# Patient Record
Sex: Female | Born: 1975 | Race: White | Hispanic: No | Marital: Married | Smoking: Former smoker
Health system: Southern US, Community
[De-identification: ages and names within clinical notes are randomized; demographics above are authoritative.]

## PROBLEM LIST (undated history)

## (undated) DIAGNOSIS — R51 Headache: Secondary | ICD-10-CM

## (undated) DIAGNOSIS — F32A Depression, unspecified: Secondary | ICD-10-CM

## (undated) DIAGNOSIS — R519 Headache, unspecified: Secondary | ICD-10-CM

## (undated) DIAGNOSIS — K08109 Complete loss of teeth, unspecified cause, unspecified class: Secondary | ICD-10-CM

## (undated) DIAGNOSIS — F329 Major depressive disorder, single episode, unspecified: Secondary | ICD-10-CM

## (undated) DIAGNOSIS — R569 Unspecified convulsions: Secondary | ICD-10-CM

## (undated) DIAGNOSIS — F419 Anxiety disorder, unspecified: Secondary | ICD-10-CM

## (undated) DIAGNOSIS — K219 Gastro-esophageal reflux disease without esophagitis: Secondary | ICD-10-CM

## (undated) DIAGNOSIS — Z972 Presence of dental prosthetic device (complete) (partial): Secondary | ICD-10-CM

## (undated) HISTORY — DX: Anxiety disorder, unspecified: F41.9

## (undated) HISTORY — DX: Headache, unspecified: R51.9

## (undated) HISTORY — DX: Headache: R51

## (undated) HISTORY — DX: Complete loss of teeth, unspecified cause, unspecified class: K08.109

## (undated) HISTORY — DX: Major depressive disorder, single episode, unspecified: F32.9

## (undated) HISTORY — DX: Unspecified convulsions: R56.9

## (undated) HISTORY — DX: Gastro-esophageal reflux disease without esophagitis: K21.9

## (undated) HISTORY — DX: Presence of dental prosthetic device (complete) (partial): Z97.2

## (undated) HISTORY — PX: WISDOM TOOTH EXTRACTION: SHX21

## (undated) HISTORY — DX: Depression, unspecified: F32.A

## (undated) HISTORY — PX: TUBAL LIGATION: SHX77

---

## 2001-02-21 ENCOUNTER — Ambulatory Visit (HOSPITAL_COMMUNITY): Admission: RE | Admit: 2001-02-21 | Discharge: 2001-02-21 | Payer: Self-pay | Admitting: Specialist

## 2001-10-23 ENCOUNTER — Emergency Department (HOSPITAL_COMMUNITY): Admission: EM | Admit: 2001-10-23 | Discharge: 2001-10-23 | Payer: Self-pay | Admitting: Emergency Medicine

## 2001-10-31 ENCOUNTER — Encounter: Payer: Self-pay | Admitting: Internal Medicine

## 2001-10-31 ENCOUNTER — Ambulatory Visit (HOSPITAL_COMMUNITY): Admission: RE | Admit: 2001-10-31 | Discharge: 2001-10-31 | Payer: Self-pay | Admitting: Internal Medicine

## 2003-06-13 ENCOUNTER — Ambulatory Visit (HOSPITAL_COMMUNITY): Admission: RE | Admit: 2003-06-13 | Discharge: 2003-06-13 | Payer: Self-pay | Admitting: *Deleted

## 2003-08-13 ENCOUNTER — Ambulatory Visit (HOSPITAL_COMMUNITY): Admission: RE | Admit: 2003-08-13 | Discharge: 2003-08-13 | Payer: Self-pay | Admitting: *Deleted

## 2003-11-07 ENCOUNTER — Inpatient Hospital Stay (HOSPITAL_COMMUNITY): Admission: AD | Admit: 2003-11-07 | Discharge: 2003-11-09 | Payer: Self-pay | Admitting: *Deleted

## 2003-12-12 ENCOUNTER — Ambulatory Visit (HOSPITAL_COMMUNITY): Admission: RE | Admit: 2003-12-12 | Discharge: 2003-12-12 | Payer: Self-pay | Admitting: *Deleted

## 2004-06-25 ENCOUNTER — Emergency Department (HOSPITAL_COMMUNITY): Admission: EM | Admit: 2004-06-25 | Discharge: 2004-06-25 | Payer: Self-pay | Admitting: Emergency Medicine

## 2004-08-30 ENCOUNTER — Emergency Department (HOSPITAL_COMMUNITY): Admission: EM | Admit: 2004-08-30 | Discharge: 2004-08-30 | Payer: Self-pay | Admitting: Emergency Medicine

## 2007-10-29 ENCOUNTER — Emergency Department (HOSPITAL_COMMUNITY): Admission: EM | Admit: 2007-10-29 | Discharge: 2007-10-29 | Payer: Self-pay | Admitting: Emergency Medicine

## 2009-02-02 ENCOUNTER — Emergency Department (HOSPITAL_COMMUNITY): Admission: EM | Admit: 2009-02-02 | Discharge: 2009-02-02 | Payer: Self-pay | Admitting: Emergency Medicine

## 2009-12-10 ENCOUNTER — Emergency Department (HOSPITAL_COMMUNITY): Admission: EM | Admit: 2009-12-10 | Discharge: 2009-12-10 | Payer: Self-pay | Admitting: Emergency Medicine

## 2010-11-17 LAB — URINALYSIS, ROUTINE W REFLEX MICROSCOPIC
Bilirubin Urine: NEGATIVE
Glucose, UA: NEGATIVE mg/dL
Nitrite: NEGATIVE
Protein, ur: >300 mg/dL — AB
Specific Gravity, Urine: 1.015 (ref 1.005–1.030)
Urobilinogen, UA: 1 mg/dL (ref 0.0–1.0)
pH: 8.5 — ABNORMAL HIGH (ref 5.0–8.0)

## 2010-11-17 LAB — URINE MICROSCOPIC-ADD ON

## 2010-12-26 NOTE — Discharge Summary (Signed)
[name], [name]                       ACCOUNT NO.:  [id number]   MEDICAL RECORD NO.:  [id number]                   PATIENT TYPE:  INP   LOCATION:  A402                                 FACILITY:  [facility]   PHYSICIAN:  Langley Gauss, M.D.                DATE OF BIRTH:  May 23, 1976   DATE OF ADMISSION:  11/07/2003  DATE OF DISCHARGE:  11/09/2003                                 DISCHARGE SUMMARY   DATE OF ADMISSION:  November 07, 2003.   DATE OF EPIDURAL:  November 07, 2003.   DATE OF VAGINAL DELIVERY:  November 07, 2003.   DATE OF DISCHARGE:  November 09, 2003.   DIAGNOSES:  Term pregnancy, delivered utilizing epidural analgesic.   DISPOSITION:  At the time of discharge the patient is bottle feeding.  She  does desire permanent sterilization.  This has been discussed previously in  the office.  She will thus, follow up in the office in 3 weeks time for  scheduling of this as an outpatient procedure.  The patient is give a copy  of the standardized discharge instructions at the time of discharge.   PERTINENT LABORATORY STUDIES:  GBS carrier status is negative.  O positive  blood type.  Admission hemoglobin and hematocrit 11.0/32.6 with a white  count of 14.2; postpartum day #1 H&H 10.8/31.8 with a white count of 8.6.   HOSPITAL COURSE:  See previous dictations.  The patient delivered in a well  controlled manner on November 07, 2003.  Postpartum the patient did well.  She  had no postpartum complications.  She bonded well with the infant and had no  significant problems with postpartum bleeding, thus she was discharged to  home on November 09, 2003. Pertinently infant circumcision also performed on  November 09, 2003.     ___________________________________________                                         Langley Gauss, M.D.   DC/MEDQ  D:  11/11/2003  T:  11/12/2003  Job:  [id number]

## 2010-12-26 NOTE — H&P (Signed)
[name], BROUSE                       ACCOUNT NO.:  [id number]   MEDICAL RECORD NO.:  [id number]                   PATIENT TYPE:  INP   LOCATION:  A417                                 FACILITY:  [facility]   PHYSICIAN:  Langley Gauss, M.D.                DATE OF BIRTH:  June 07, 1976   DATE OF ADMISSION:  11/07/2003  DATE OF DISCHARGE:                                HISTORY & PHYSICAL   REASON FOR ADMISSION:  This is a 35 year old Gravida IV, Para 3 at 71 and  4/[redacted] weeks gestation who is admitted for induction of labor secondary to  impending post dates.  The patient's prenatal course has been uncomplicated.  She is noted to be O positive blood type.  She has had 19 and 27 week  ultrasounds with normal anatomic survey and normal fetal growth.  She is  known to be GBS carrier status negative.  She has had normal GTT's with  previous pregnancies, thus it was not repeated during this pregnancy.   PAST OB HISTORY:  Pertinent, however, for a vaginal delivery on May 04, 1997 at [facility] of a 7 pound 2 ounce infant.  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  The patient did have a level  two ultrasound scheduled at [facility] in [place] during the  second trimester.  However, she failed to make appropriate effort to keeping  this appointment.  She actually got to the appointment too late and the  ultrasound was not performed at that time and the patient did not  reschedule.  However, as stated previously anatomic survey performed per  [facility] department of radiology was within normal limits.  In  January of 1994 she had a vaginal delivery of a 7 pound 12 ounce infant.  In  September of 1998 she had a vaginal delivery  of a 7 pound 2 ounce infant.  In August of 2001 she had a vaginal delivery of a 7 pound 7 ounce infant.  In the patient's third pregnancy she was noted to have an abnormal AFP.  She  did have an amniocentesis which was normal.  The APT triple screen during  this pregnancy is noted to be within normal limits.   ALLERGIES:  The patient has no known drug allergies.   PAST MEDICAL HISTORY:  1. Prior vaginal deliveries.  2. History of recurrent oral HSV, however, she denies any history of genital     HSV.   PHYSICAL EXAMINATION:  GENERAL:  She is in no acute distress, a white  female.  VITAL SIGNS:  Height is 5'4, pre-pregnancy weight was 128, most recent  pregnancy 167, blood pressure 123/77,  pulse rate 80, respiratory rate 20.  HEENT:  Negative.  There is no adenopathy.  The neck is supple.  The thyroid  is non-palpable.  LUNGS:  Clear.  CARDIOVASCULAR EXAM:  Regular rate and rhythm.  ABDOMEN:  Soft and non-tender.  Gravid uterus identified.  No surgical scars  are identified.  She is vertex presentation by Leopold's maneuvers.  EXTREMITIES:  Within normal limits.  Trace edema only.  PELVIC EXAM:  Normal external genitalia.  No lesions or ulcerations are  identified.  No vaginal bleeding or leakage of fluid.  Cervix is noted to be  3 cm dilated, 70% effaced, -1 station, posterior position of the cervix with  the vertex, however, well applied.  Fetal heart tones are auscultated in the  150's.   ASSESSMENT:  Forty and 4/7 week intrauterine pregnancy in this multigravida  patient.  The patient is noted to have a history of rapid second stage of  labor.  She is GBS negative.  She is noted to have a very favorable cervix  for induction.   PLAN:  The plan is to admit the patient, proceed with amniotomy and after  appropriate expectant management we will augment or induce with Pitocin as  clinically indicated.     ___________________________________________                                          Langley Gauss, M.D.   DC/MEDQ  D:  11/07/2003  T:  11/07/2003  Job:  [id number]

## 2010-12-26 NOTE — H&P (Signed)
[name], [name]                       ACCOUNT NO.:  [id number]   MEDICAL RECORD NO.:  [id number]                   PATIENT TYPE:  AMB   LOCATION:  DAY                                  FACILITY:  [facility]   PHYSICIAN:  Langley Gauss, M.D.                DATE OF BIRTH:  [birth year]   DATE OF ADMISSION:  12/12/2003  DATE OF DISCHARGE:                                HISTORY & PHYSICAL   HISTORY OF PRESENT ILLNESS:  This is a 35 year old gravida 4, para 4 with  four prior vaginal deliveries with a 5th child also living in her home, who  desires permanent and irreversible sterilization at this time.  She accepts  the inherent 2% failure rate associated with the procedure, and does  understand that this is to be considered an irreversible procedure.  The  patient is postpartum OB November 07, 2003, without complications, following  delivery of her fourth child.   PAST MEDICAL HISTORY:  The patient does have a history of oral HSV.  She  also has tenia versicolor on the back of her neck which is just begun on  retreatment following her recent delivery.  She has four prior vaginal  deliveries without complications.   ALLERGIES:  She has no known drug allergies.   CURRENT MEDICATIONS:  She was recently started on Flagyl 500 mg p.o. b.i.d.  x7 days for a brownish, postpartum vaginal discharge.   PHYSICAL EXAMINATION:  GENERAL:  A pleasant, white female, nonsmoker.  VITAL SIGNS:  She is 5 feet, 4 inches, 152 pounds, blood pressure 143/79,  pulse rate of 80, respiratory rate 20.  HEENT:  Negative.  No adenopathy.  NECK:  Supple.  Thyroid is nonpalpable.  LUNGS:  Clear.  CARDIOVASCULAR:  Regular rate and rhythm.  ABDOMEN:  Soft, nontender, no surgical scars were identified.  PELVIC:  On exam, normal external genitalia.  No lesions or ulcerations were  identified.  The bladder is well supported as well as the urethra.  Cervix  is noted to be without lesions.  The brown discharge described  previously  has been treated.  Bimanual examination, the uterus is noted to be slightly  increased size, consistent with postpartum state.  It is noted to be  retroflexed and freely mobile.  There are no adnexal masses.  Adnexa are  palpably normal without tenderness appreciated.   LABORATORY DATA:  Reveal a negative serum hCG.  Hemoglobin 13.5, hematocrit  39.9 with a white count of 5.9.   ASSESSMENT:  1. Multiparous patient adamantly desires permanent sterilization.  This has     been discussed with her extensively during her prenatal course.  Now     following her fourth vaginal delivery, she likewise would like to pursue     permanent sterilization.  She are aware that there are other reversible     forms of birth control available, but would like to proceed at this time.  Her husband, likewise, is in agreement with this decision.  2. Thus, on Dec 12, 2003, we will proceed with laparoscopic tubal ligation     utilizing bipolar cautery.  Risks of the surgery were discussed to     include risk of bowel or bladder injury.  Also risk associated with the     use of general anesthesia.     ___________________________________________                                         Langley Gauss, M.D.   DC/MEDQ  D:  12/12/2003  T:  12/12/2003  Job:  [id number]

## 2010-12-26 NOTE — Op Note (Signed)
[name], [name]                       ACCOUNT NO.:  [id number]   MEDICAL RECORD NO.:  [id number]                   PATIENT TYPE:  INP   LOCATION:  A417                                 FACILITY:  [facility]   PHYSICIAN:  Langley Gauss, M.D.                DATE OF BIRTH:  04-28-1976   DATE OF PROCEDURE:  11/07/2003  DATE OF DISCHARGE:                                 OPERATIVE REPORT   PROCEDURE:  Placement of continuous lumbar epidural analgesia at the L2-3  interspace.   SURGEON:  Langley Gauss, M.D.   SUMMARY:  Appropriate informed consent is obtained.  Continuous electronic  fetal monitoring is performed. The patient placed in a seated position, bony  landmarks were identified. The L2-3 interspace is chosen. The patient's back  is sterilely prepped and draped utilizing the epidural tray.  5 mL of 1%  lidocaine plain injected at the midline of the L2-3 interspace to raise a  small skin wheal. This 17 gauge Tuohy-Schliff needle is then utilized with  loss of resistance in an air filled glass syringe to identify entry into the  epidural space on the first attempt without difficulty.  No signs of CSF or  intravascular injury noted. Initial test dose 5 mL of 1.5% lidocaine plus  epinephrine injected through the epidural needle. No signs of CSF or  intravascular injection obtained.  The epidural catheter was inserted to a  depth of 5 cm, needle was removed, aspiration test is negative. A second  test dose 2 mL, 1.5% lidocaine plus epinephrine injected through the  epidural catheter, again no signs of CSF or intravascular injection obtained  thus the catheter was secured into place.  The patient is returned to the  lateral supine position.  She is connected to the infusion pump with the  standard mixture, she will be treated with a bolus of 10 mL followed by a  continuous infusion rate of 14 mL per hour.  After administration of the  bolus, the patient is having significant numbness  and warmth in the right  lower extremity; however, continues to have the sensation of pain with  uterine contractions thus she is in addition given a bolus of 5, 5 mL of 2%  lidocaine to facilitate the epidural block.  The patient continues to have a  reassuring fetal heart rate. Notably just prior to placement, examination  per the nursing staff had revealed the patient to be 7 cm dilated.      ___________________________________________                                            Langley Gauss, M.D.   DC/MEDQ  D:  11/07/2003  T:  11/07/2003  Job:  [id number]

## 2010-12-26 NOTE — Discharge Summary (Signed)
[name], [name]                       ACCOUNT NO.:  [id number]   MEDICAL RECORD NO.:  [id number]                   PATIENT TYPE:  AMB   LOCATION:  DAY                                  FACILITY:  [facility]   PHYSICIAN:  Langley Gauss, M.D.                DATE OF BIRTH:  1975/08/11   DATE OF ADMISSION:  12/12/2003  DATE OF DISCHARGE:                                 DISCHARGE SUMMARY   DISPOSITION:  The patient is to be discharged today on same date of service.  She has a previously written prescription for Tylox which she has used in  the postpartum state.   LABORATORY DATA:  Reviewed previously.   HOSPITAL COURSE:  Admitted to the ambulatory surgical unit.  Laparoscopic  tubal ligation performed without complications on same date of service.  The  patient should be prepared for discharge likewise on same date of service.     ___________________________________________                                         Langley Gauss, M.D.   DC/MEDQ  D:  12/12/2003  T:  12/12/2003  Job:  [id number]

## 2010-12-26 NOTE — Op Note (Signed)
[name], [name]                       ACCOUNT NO.:  [id number]   MEDICAL RECORD NO.:  [id number]                   PATIENT TYPE:  INP   LOCATION:  A402                                 FACILITY:  [facility]   PHYSICIAN:  Langley Gauss, M.D.                DATE OF BIRTH:  Jul 10, 1976   DATE OF PROCEDURE:  11/07/2003  DATE OF DISCHARGE:                                 OPERATIVE REPORT   PREOPERATIVE DIAGNOSIS:  40-4/7 weeks intrauterine pregnancy for induction  of labor.   PROCEDURE:  1. Placement of continuous lumbar epidural analgesia by Dr. Lisette Grinder on     11/07/2003.  2. Spontaneous assisted vaginal delivery of a 7 pound 13 ounce female infant.  3. Midline episiotomy repair.   SURGEON:  Langley Gauss, M.D.   ESTIMATED BLOOD LOSS:  Less than 500 cc.   SPECIMENS:  Arterial cord gas and cord blood to laboratory.  The placenta  was examined and noted to be apparently intact with a three-vessel umbilical  cord.   SUMMARY:  The patient was admitted for induction of labor at 40-4/[redacted] weeks  gestation.  On initial examination, she was 3 cm dilated.  Amniotomy was  performed, with findings of clear amniotic fluid.  The patient did require  Pitocin induction to achieve adequate labor.  Initially she requested IV  Nubain and Phenergan for pain relief; however, with onset of discomfort  associated with uterine contractions, epidural was placed when the patient  was noted to be 7 cm dilated.  Subsequently, she progressed readily to  complete dilatation.  She had minimal urge to push, with discomfort  associated with uterine contractions.  When the patient began having pelvic  pressure, she was noted to be completely dilated.  She was placed in the  dorsal lithotomy position and prepped and draped in the usual sterile  manner.  She pushed well during a short second stage of labor to deliver in  a direct LOA position over an intact perineum.  The mouth and nares were  bulb suctioned of clear  amniotic fluid.  Renewed expulsive efforts resulted  in spontaneous rotation to a right anterior shoulder position.  Gentle  downward traction combined with expulsive efforts resulted in delivery of  the shoulder as well as the remainder of the infant without difficulty.  The  umbilical cord was milked towards the infant.  The cord was doubly clamped  and cut.  A spontaneous cry was noted.  Arterial cord gas and cord blood  were then obtained from the nuchal cord.  Gentle traction on the umbilical  cord resulted in separation which upon examination was noted to be an  apparently intact placenta with associated three-vessel umbilical cord.  Examination of the genital tract revealed an intact perineum.  No  lacerations were noted to  have occurred.  Excellent uterine tone was noted following delivery.  The  patient was taken out of the dorsal  lithotomy position and rolled to her  side at which time the epidural catheter was removed, with the blue tip  noted to be intact.  Delivery was performed by Dr. Langley Gauss.     ___________________________________________                                            Langley Gauss, M.D.   DC/MEDQ  D:  11/08/2003  T:  11/08/2003  Job:  [id number]

## 2010-12-26 NOTE — Op Note (Signed)
NAMEANNALYSA, [name]                       ACCOUNT NO.:  [id number]   MEDICAL RECORD NO.:  [id number]                   PATIENT TYPE:  AMB   LOCATION:  DAY                                  FACILITY:  [facility]   PHYSICIAN:  Langley Gauss, M.D.                DATE OF BIRTH:  April 22, 1976   DATE OF PROCEDURE:  12/12/2003  DATE OF DISCHARGE:                                 OPERATIVE REPORT   PREOPERATIVE DIAGNOSIS:  Desires permanent sterilization.   POSTOPERATIVE DIAGNOSIS:  Desires permanent sterilization.   OPERATION/PROCEDURE:  Laparoscopic tubal ligation utilizing bipolar cautery.   SURGEON:  Langley Gauss, M.D.   ANESTHESIA:  General endotracheal anesthesia.   SPECIMENS:  None.   DRAINS:  Prior to procedure, red rubber catheter was used to drain 75 mL of  clear, yellow urine from the bladder.   FINDINGS:  Multiparous size postpartum uterus.  Ovaries normal in  appearance.  Fallopian tubes likewise normal in appearance.  No adhesive  disease encountered.  With gentle traction of the cervix, however, there was  noted to be excellent uterine descent with the uterosacral ligaments  descending to the hymenal ring.   DESCRIPTION OF PROCEDURE:  The patient was taken to the operating room.  Vital signs were stable.  She underwent induction of general anesthesia.  Placed in the dorsal lithotomy position.  Prepped and draped in the sterile  manner.  The red rubber catheter was used to drain 75 mL of clear urine from  the bladder.  Sterile speculum examination was performed.  Cervix without  lesions.  Anterior lip of the cervix grasped with a single-tooth tenaculum.  A Hulka intrauterine manipulator was then used to grasp the cervix at 12  o'clock __________ the point into the lower uterine segment.  I then changed  to sterile gloves and the patient's left side, stand at the patient's left  side.  A 1 cm vertical incision was made just superior to the umbilicus.  A  midline  suprapubic incision is made.  Abdominal wall is then elevated.  A  Veress needle is then passed through the subumbilical incision perpendicular  to the fascial plane. Drop test confirms proper intraperitoneal placement  with no apparent injury noted.  A 3.5 L of CO2 gas insufflated, with filling  pressure less than 15 mmHg.  After adequate pneumoperitoneum, Veress needle  was removed.  Abdominal wall is again elevated.  The 10 mm Visiport,  disposable non-bladed trocar and sleeve are inserted through this  subumbilical incision, angling towards the hollow sacrum. This is likewise  done atraumatically under direct visualization.  Pelvic contents visualized.  Under direct visualization a 5 mm trocar and sleeve are then inserted  through the suprapubic incision.  This likewise is done atraumatically.  This then allows adequate visualization of the pelvis with the patient now  in the Trendelenburg position.  The Kleppinger bipolar cautery is then  utilized with the  triple cauterization technique performed with three  cauterizations at of each fallopian tube performed, the most proximal being  1 cm from the tubo-uterine junction.  At all times, cauterization is  continued until the meter drops down to 0.  This results in extensive tubal  __________ bilaterally with no apparent vascular injury.  As the procedure  is terminated, the gas is allowed to escape and her instruments are removed.  The incisions  are closed utilizing a 0 Vicryl interrupted suture through the subcutaneous  tissue which is easily reapproximates the skin edges.  The patient tolerated  the procedure very well.  She was extubated and taken to the recovery room  in stable condition at which time operative findings would be discussed with  the patient's awaiting family.      ___________________________________________                                            Langley Gauss, M.D.   DC/MEDQ  D:  12/12/2003  T:  12/12/2003   Job:  [id number]

## 2011-05-04 LAB — DIFFERENTIAL
Basophils Absolute: 0
Basophils Relative: 0
Eosinophils Absolute: 0
Eosinophils Relative: 0
Lymphocytes Relative: 10 — ABNORMAL LOW
Lymphs Abs: 1.2
Monocytes Absolute: 0.4
Monocytes Relative: 3
Neutro Abs: 10.3 — ABNORMAL HIGH
Neutrophils Relative %: 87 — ABNORMAL HIGH

## 2011-05-04 LAB — BASIC METABOLIC PANEL WITH GFR
BUN: 7
CO2: 24
Calcium: 9.4
Chloride: 108
Creatinine, Ser: 0.91
GFR calc non Af Amer: >60
Glucose, Bld: 97
Potassium: 3.9
Sodium: 138

## 2011-05-04 LAB — CBC
HCT: 37.8
Hemoglobin: 12.7
MCHC: 33.6
MCV: 77 — ABNORMAL LOW
RBC: 4.91
RDW: 13.4

## 2011-09-21 ENCOUNTER — Encounter (HOSPITAL_COMMUNITY): Payer: Self-pay | Admitting: Emergency Medicine

## 2011-09-21 ENCOUNTER — Emergency Department (HOSPITAL_COMMUNITY)
Admission: EM | Admit: 2011-09-21 | Discharge: 2011-09-21 | Disposition: A | Discharge status: Home / Self Care | Payer: Self-pay | Attending: Emergency Medicine | Admitting: Emergency Medicine

## 2011-09-21 DIAGNOSIS — Z87891 Personal history of nicotine dependence: Secondary | ICD-10-CM | POA: Insufficient documentation

## 2011-09-21 DIAGNOSIS — Z9851 Tubal ligation status: Secondary | ICD-10-CM | POA: Insufficient documentation

## 2011-09-21 DIAGNOSIS — N390 Urinary tract infection, site not specified: Secondary | ICD-10-CM | POA: Insufficient documentation

## 2011-09-21 LAB — URINALYSIS, ROUTINE W REFLEX MICROSCOPIC
Bilirubin Urine: NEGATIVE
Nitrite: POSITIVE — AB
Protein, ur: >300 mg/dL — AB
Specific Gravity, Urine: >1.03 — ABNORMAL HIGH (ref 1.005–1.030)
Urobilinogen, UA: >8 mg/dL — ABNORMAL HIGH (ref 0.0–1.0)

## 2011-09-21 LAB — URINE MICROSCOPIC-ADD ON

## 2011-09-21 MED ORDER — HYDROCODONE-ACETAMINOPHEN 5-325 MG PO TABS
1.0000 | ORAL_TABLET | Freq: Once | ORAL | Status: AC
  Administered 2011-09-21: 1 via ORAL
  Filled 2011-09-21: qty 1

## 2011-09-21 MED ORDER — CEPHALEXIN 500 MG PO CAPS: 500.0000 mg | ORAL_CAPSULE | Freq: Four times a day (QID) | ORAL | Status: AC

## 2011-09-21 MED ORDER — CEPHALEXIN 500 MG PO CAPS
500.0000 mg | ORAL_CAPSULE | Freq: Once | ORAL | Status: AC
  Administered 2011-09-21: 500 mg via ORAL
  Filled 2011-09-21: qty 1

## 2011-09-21 MED ORDER — PHENAZOPYRIDINE HCL 100 MG PO TABS
200.0000 mg | ORAL_TABLET | Freq: Once | ORAL | Status: AC
  Administered 2011-09-21: 200 mg via ORAL
  Filled 2011-09-21: qty 2

## 2011-09-21 MED ORDER — PHENAZOPYRIDINE HCL 200 MG PO TABS: 200.0000 mg | ORAL_TABLET | Freq: Three times a day (TID) | ORAL | Status: AC

## 2011-09-21 NOTE — Discharge Instructions (Signed)

## 2011-09-21 NOTE — ED Notes (Signed)
Patient c/o urinary tract infection symptoms x one week.  States has been taking AZO without any relief.

## 2011-09-21 NOTE — ED Provider Notes (Signed)
History     CSN: [id number]  Arrival date & time 09/21/11  2131   First MD Initiated Contact with Patient 09/21/11 2139      Chief Complaint  Patient presents with  . Urinary Tract Infection    (Consider location/radiation/quality/duration/timing/severity/associated sxs/prior treatment) Patient is a 36 y.o. female presenting with urinary tract infection. The history is provided by the patient. No language interpreter was used.  Urinary Tract Infection This is a new problem. The current episode started 1 to 4 weeks ago. The problem occurs constantly. The problem has been unchanged. Associated symptoms include abdominal pain, nausea and urinary symptoms. Pertinent negatives include no change in bowel habit, chills, fever, headaches, neck pain, sore throat, swollen glands, vomiting or weakness. Exacerbated by: Urination. Treatments tried: Over-the-counter AZO. The treatment provided no relief.    History reviewed. No pertinent past medical history.  Past Surgical History  Procedure Date  . Tubal ligation     History reviewed. No pertinent family history.  History  Substance Use Topics  . Smoking status: Former Games developer  . Smokeless tobacco: Not on file  . Alcohol Use: No    OB History    Grav Para Term Preterm Abortions TAB SAB Ect Mult Living                  Review of Systems  Constitutional: Negative for fever and chills.  HENT: Negative for sore throat and neck pain.   Gastrointestinal: Positive for nausea and abdominal pain. Negative for vomiting and change in bowel habit.  Genitourinary: Positive for dysuria, urgency, frequency and hematuria. Negative for flank pain, vaginal bleeding, vaginal discharge, vaginal pain and menstrual problem.  Musculoskeletal: Negative.   Skin: Negative.   Neurological: Negative for weakness and headaches.  All other systems reviewed and are negative.    Allergies  Review of patient's allergies indicates no known allergies.  Home  Medications  No current outpatient prescriptions on file.  BP 112/79  Pulse 62  Temp(Src) 97.6 F (36.4 C) (Oral)  Resp 18  Ht 5\' 4"  (1.626 m)  Wt 148 lb (67.132 kg)  BMI 25.40 kg/m2  SpO2 99%  LMP 09/07/2011  Physical Exam  Nursing note and vitals reviewed. Constitutional: She is oriented to person, place, and time. She appears well-developed and well-nourished. No distress.  HENT:  Head: Normocephalic and atraumatic.  Mouth/Throat: Oropharynx is clear and moist.  Neck: Neck supple.  Cardiovascular: Normal rate, regular rhythm and normal heart sounds.   Pulmonary/Chest: Effort normal and breath sounds normal. No respiratory distress.  Abdominal: Soft. She exhibits no distension and no mass. There is tenderness in the suprapubic area. There is no rebound, no guarding, no CVA tenderness and no tenderness at McBurney's point.  Musculoskeletal: Normal range of motion.  Neurological: She is alert and oriented to person, place, and time. She exhibits normal muscle tone. Coordination normal.  Skin: Skin is warm and dry.    ED Course  Procedures (including critical care time)  Results for orders placed during the hospital encounter of 09/21/11  URINALYSIS, ROUTINE W REFLEX MICROSCOPIC      Component Value Range   Color, Urine ORANGE (*) YELLOW    APPearance CLEAR  CLEAR    Specific Gravity, Urine >1.030 (*) 1.005 - 1.030    pH 5.0  5.0 - 8.0    Glucose, UA 250 (*) NEGATIVE (mg/dL)   Hgb urine dipstick LARGE (*) NEGATIVE    Bilirubin Urine NEGATIVE  NEGATIVE    Ketones,  ur TRACE (*) NEGATIVE (mg/dL)   Protein, ur >308 (*) NEGATIVE (mg/dL)   Urobilinogen, UA >6.5 (*) 0.0 - 1.0 (mg/dL)   Nitrite POSITIVE (*) NEGATIVE    Leukocytes, UA MODERATE (*) NEGATIVE   POCT PREGNANCY, URINE      Component Value Range   Preg Test, Ur NEGATIVE  NEGATIVE   URINE MICROSCOPIC-ADD ON      Component Value Range   Squamous Epithelial / LPF RARE  RARE    WBC, UA TOO NUMEROUS TO COUNT  <3  (WBC/hpf)   RBC / HPF 21-50  <3 (RBC/hpf)   Bacteria, UA MANY (*) RARE       Urine culture pending   MDM    UTI without fever vomiting or CVA tenderness doubt pyelonephritis.  Urine culture is pending we'll start antibiotics.  Patient agrees to close followup with her primary care physician or to return here if symptoms worsen    Patient / Family / Caregiver understand and agree with initial ED impression and plan with expectations set for ED visit. Pt feels improved after observation and/or treatment in ED. Pt stable in ED with no significant deterioration in condition.     Shenelle Klas L. [place], [place] 09/21/11 2223

## 2011-09-22 NOTE — ED Provider Notes (Signed)
Medical screening examination/treatment/procedure(s) were performed by non-physician practitioner and as supervising physician I was immediately available for consultation/collaboration.  Shelanda Duvall S. Phu Record, MD 09/22/11 1616 

## 2011-09-24 LAB — URINE CULTURE
Colony Count: 50000
Culture  Setup Time: 201302121439

## 2011-09-25 NOTE — ED Notes (Signed)
+   Urine  Chart sent to EDP office; no sensitivities listed 

## 2011-09-27 NOTE — ED Notes (Signed)
Chart returned from EDP office. No change needed. Reviewed by Levy Sjogren NP

## 2017-05-10 ENCOUNTER — Emergency Department (HOSPITAL_COMMUNITY)
Admission: EM | Admit: 2017-05-10 | Discharge: 2017-05-11 | Disposition: A | Discharge status: Home / Self Care | Payer: Self-pay | Attending: Emergency Medicine | Admitting: Emergency Medicine

## 2017-05-10 ENCOUNTER — Encounter (HOSPITAL_COMMUNITY): Payer: Self-pay | Admitting: Emergency Medicine

## 2017-05-10 ENCOUNTER — Emergency Department (HOSPITAL_COMMUNITY): Payer: Self-pay

## 2017-05-10 DIAGNOSIS — J069 Acute upper respiratory infection, unspecified: Secondary | ICD-10-CM

## 2017-05-10 DIAGNOSIS — Z87891 Personal history of nicotine dependence: Secondary | ICD-10-CM | POA: Insufficient documentation

## 2017-05-10 DIAGNOSIS — B349 Viral infection, unspecified: Secondary | ICD-10-CM | POA: Insufficient documentation

## 2017-05-10 DIAGNOSIS — B9789 Other viral agents as the cause of diseases classified elsewhere: Secondary | ICD-10-CM

## 2017-05-10 DIAGNOSIS — Z79899 Other long term (current) drug therapy: Secondary | ICD-10-CM | POA: Insufficient documentation

## 2017-05-10 LAB — RAPID STREP SCREEN (MED CTR MEBANE ONLY): STREPTOCOCCUS, GROUP A SCREEN (DIRECT): NEGATIVE

## 2017-05-10 NOTE — ED Triage Notes (Signed)
Pt c/o cough, headache, body aches, sore throat and fever that started today.

## 2017-05-11 MED ORDER — ACETAMINOPHEN 325 MG PO TABS
650.0000 mg | ORAL_TABLET | Freq: Once | ORAL | Status: AC
  Administered 2017-05-11: 650 mg via ORAL
  Filled 2017-05-11: qty 2

## 2017-05-11 NOTE — ED Provider Notes (Signed)
[facility]-EMERGENCY DEPT Provider Note   CSN: [id number] Arrival date & time: 05/10/17  2111     History   Chief Complaint Chief Complaint  Patient presents with  . Cough    HPI [name] is a 41 y.o. female.  The history is provided by the patient.  Cough  This is a new problem. The current episode started 12 to 24 hours ago. The problem occurs hourly. The problem has been gradually worsening. The cough is non-productive. The maximum temperature recorded prior to her arrival was 100 to 100.9 F. Associated symptoms include chills, headaches, sore throat and myalgias. Pertinent negatives include no shortness of breath. She is not a smoker.     PMH -none Soc hx - nonsmoker No travel +sick contacts Past Surgical History:  Procedure Laterality Date  . TUBAL LIGATION      OB History    No data available       Home Medications    Prior to Admission medications   Medication Sig Start Date End Date Taking? Authorizing Provider  acetaminophen (TYLENOL) 500 MG tablet Take 1,000 mg by mouth as needed. For pain    [provider]  lisinopril-hydrochlorothiazide (PRINZIDE,ZESTORETIC) 10-12.5 MG per tablet Take 1 tablet by mouth daily.    [provider]    Family History History reviewed. No pertinent family history.  Social History Social History  Substance Use Topics  . Smoking status: Former Games developer  . Smokeless tobacco: Never Used  . Alcohol use Yes     Allergies   Patient has no known allergies.   Review of Systems Review of Systems  Constitutional: Positive for chills.  HENT: Positive for sore throat.   Respiratory: Positive for cough. Negative for shortness of breath.   Gastrointestinal: Negative for diarrhea and vomiting.  Musculoskeletal: Positive for myalgias.  Neurological: Positive for headaches.  All other systems reviewed and are negative.    Physical Exam Updated Vital Signs BP 131/80   Pulse 70   Temp (!) 100.6 F  (38.1 C) (Oral)   Resp 20   Ht 1.626 m ( )   Wt 81.6 kg (180 lb)   LMP 04/28/2017   SpO2 100%   BMI 30.90 kg/m   Physical Exam CONSTITUTIONAL: Well developed/well nourished HEAD: Normocephalic/atraumatic EYES: EOMI/PERRL ENMT: Mucous membranes moist, uvula midline, no erythema/exudates NECK: supple no meningeal signs SPINE/BACK:entire spine nontender CV: S1/S2 noted, no murmurs/rubs/gallops noted LUNGS: Lungs are clear to auscultation bilaterally, no apparent distress ABDOMEN: soft, nontender, no rebound or guarding, bowel sounds noted throughout abdomen GU:no cva tenderness NEURO: Pt is awake/alert/appropriate, moves all extremitiesx4.  No facial droop.   EXTREMITIES: pulses normal/equal, full ROM SKIN: warm, color normal PSYCH: no abnormalities of mood noted, alert and oriented to situation   ED Treatments / Results  Labs (all labs ordered are listed, but only abnormal results are displayed) Labs Reviewed  RAPID STREP SCREEN (NOT AT [facility])  CULTURE, GROUP A STREP [facility])    EKG  EKG Interpretation None       Radiology Dg Chest 2 View  Result Date: 05/10/2017 CLINICAL DATA:  Cough, headache, body aches, sore throat, and fever starting today. Former smoker. EXAM: CHEST  2 VIEW COMPARISON:  None. FINDINGS: The heart size and mediastinal contours are within normal limits. Both lungs are clear. The visualized skeletal structures are unremarkable. IMPRESSION: No active cardiopulmonary disease. Electronically Signed   By: Burman Nieves M.D.   On: 05/10/2017 21:43    Procedures Procedures (including  critical care time)  Medications Ordered in ED Medications  acetaminophen (TYLENOL) tablet 650 mg (650 mg Oral Given 05/11/17 0054)     Initial Impression / Assessment and Plan / ED Course  I have reviewed the triage vital signs and the nursing notes.  Pertinent labs & imaging results that were available during my care of the patient were reviewed by me and  considered in my medical decision making (see chart for details).     Pt with likely viral URI No distress No hypoxia Will d/c home   Final Clinical Impressions(s) / ED Diagnoses   Final diagnoses:  Viral URI with cough    New Prescriptions Discharge Medication List as of 05/11/2017 12:47 AM       Zadie Rhine, MD 05/11/17 0128

## 2017-05-13 LAB — CULTURE, GROUP A STREP (THRC)

## 2018-03-07 ENCOUNTER — Emergency Department (HOSPITAL_COMMUNITY)
Admission: EM | Admit: 2018-03-07 | Discharge: 2018-03-07 | Disposition: A | Discharge status: Home / Self Care | Payer: Self-pay | Attending: Emergency Medicine | Admitting: Emergency Medicine

## 2018-03-07 ENCOUNTER — Other Ambulatory Visit: Payer: Self-pay

## 2018-03-07 ENCOUNTER — Encounter (HOSPITAL_COMMUNITY): Payer: Self-pay | Admitting: *Deleted

## 2018-03-07 DIAGNOSIS — Z87891 Personal history of nicotine dependence: Secondary | ICD-10-CM | POA: Insufficient documentation

## 2018-03-07 DIAGNOSIS — Z79899 Other long term (current) drug therapy: Secondary | ICD-10-CM | POA: Insufficient documentation

## 2018-03-07 DIAGNOSIS — L509 Urticaria, unspecified: Secondary | ICD-10-CM | POA: Insufficient documentation

## 2018-03-07 MED ORDER — DEXAMETHASONE SODIUM PHOSPHATE 4 MG/ML IJ SOLN
10.0000 mg | Freq: Once | INTRAMUSCULAR | Status: AC
  Administered 2018-03-07: 10 mg via INTRAMUSCULAR
  Filled 2018-03-07: qty 3

## 2018-03-07 MED ORDER — PREDNISONE 10 MG PO TABS: ORAL_TABLET | ORAL | 0 refills | Status: DC

## 2018-03-07 MED ORDER — FAMOTIDINE 20 MG PO TABS
20.0000 mg | ORAL_TABLET | Freq: Once | ORAL | Status: AC
  Administered 2018-03-07: 20 mg via ORAL
  Filled 2018-03-07: qty 1

## 2018-03-07 MED ORDER — DIPHENHYDRAMINE HCL 25 MG PO CAPS
25.0000 mg | ORAL_CAPSULE | Freq: Once | ORAL | Status: AC
  Administered 2018-03-07: 25 mg via ORAL
  Filled 2018-03-07: qty 1

## 2018-03-07 NOTE — Discharge Instructions (Addendum)
Continue taking Benadryl 1 tablet every 4-6 hours for at least 3 to 4 days.  Start the prednisone prescription tomorrow.  Follow-up with your primary care provider for recheck.  Return to the ER for any worsening symptoms.

## 2018-03-07 NOTE — ED Notes (Signed)
Pt ambulatory to waiting room. Pt verbalized understanding of discharge instructions.   

## 2018-03-07 NOTE — ED Triage Notes (Signed)
Pt c/o hives that started this am with itching, denies any sob, last dose of benadryl was at 8pm, states that she has this every few months but is not sure what is causing it,

## 2018-03-09 NOTE — ED Provider Notes (Signed)
[facility] EMERGENCY DEPARTMENT Provider Note   CSN: [id number] Arrival date & time: 03/07/18  2128     History   Chief Complaint Chief Complaint  Patient presents with  . Urticaria    HPI [name] is a 42 y.o. female.  HPI   [name] is a 42 y.o. female who presents to the Emergency Department complaining of itching and rash that began on the morning of arrival.  Symptoms have worsened throughout the day.  She describes the rash as "whelps" to both arms, abdomen, groin, and neck.  She took Benadryl 2 hours prior to arrival with minimal relief of itching.  She denies chest tightness, shortness of breath, facial swelling, swelling of the lips or tongue or difficulty swallowing.  She states this is a recurrent problem. No new chemical exposures, medications, foods or hygiene products.     History reviewed. No pertinent past medical history.  There are no active problems to display for this patient.   Past Surgical History:  Procedure Laterality Date  . TUBAL LIGATION       OB History   None      Home Medications    Prior to Admission medications   Medication Sig Start Date End Date Taking? Authorizing Provider  acetaminophen (TYLENOL) 500 MG tablet Take 1,000 mg by mouth as needed. For pain    [provider]  lisinopril-hydrochlorothiazide (PRINZIDE,ZESTORETIC) 10-12.5 MG per tablet Take 1 tablet by mouth daily.    [provider]  predniSONE (DELTASONE) 10 MG tablet Take 6 tablets day one, 5 tablets day two, 4 tablets day three, 3 tablets day four, 2 tablets day five, then 1 tablet day six 03/07/18   Tamalyn Wadsworth, PA-C    Family History No family history on file.  Social History Social History   Tobacco Use  . Smoking status: Former Games developer  . Smokeless tobacco: Never Used  Substance Use Topics  . Alcohol use: Yes  . Drug use: No     Allergies   Patient has no known allergies.   Review of Systems Review of Systems   Constitutional: Negative for activity change, appetite change, chills and fever.  HENT: Negative for facial swelling, sore throat, trouble swallowing and voice change.   Respiratory: Negative for chest tightness, shortness of breath and wheezing.   Cardiovascular: Negative for chest pain.  Musculoskeletal: Negative for neck pain and neck stiffness.  Skin: Positive for rash. Negative for wound.  Neurological: Negative for dizziness, speech difficulty, weakness, numbness and headaches.  All other systems reviewed and are negative.    Physical Exam Updated Vital Signs BP (!) 148/82 (BP Location: Right Arm)   Pulse 63   Temp (!) 97.5 F (36.4 C) (Oral)   Resp 16   Ht 5\' 4"  (1.626 m)   Wt 81.6 kg (180 lb)   LMP 03/01/2018   SpO2 99%   BMI 30.90 kg/m   Physical Exam  Constitutional: She appears well-developed and well-nourished. No distress.  HENT:  Head: Normocephalic and atraumatic.  Mouth/Throat: Uvula is midline, oropharynx is clear and moist and mucous membranes are normal. No oral lesions. No uvula swelling.  Neck: Normal range of motion. Neck supple.  Cardiovascular: Normal rate, regular rhythm and intact distal pulses.  No murmur heard. Pulmonary/Chest: Effort normal and breath sounds normal. No stridor. No respiratory distress. She has no wheezes.  Musculoskeletal: Normal range of motion. She exhibits no edema or tenderness.  Lymphadenopathy:    She has no  cervical adenopathy.  Neurological: She is alert. No sensory deficit. Coordination normal.  Skin: Skin is warm. Capillary refill takes less than 2 seconds. Rash noted. There is erythema.  Multiple erythematous welts to the bilateral arms, neck, right abdomen, bilateral groin.    Psychiatric: She has a normal mood and affect.  Nursing note and vitals reviewed.    ED Treatments / Results  Labs (all labs ordered are listed, but only abnormal results are displayed) Labs Reviewed - No data to  display  EKG None  Radiology No results found.  Procedures Procedures (including critical care time)  Medications Ordered in ED Medications  dexamethasone (DECADRON) injection 10 mg (10 mg Intramuscular Given 03/07/18 2234)  diphenhydrAMINE (BENADRYL) capsule 25 mg (25 mg Oral Given 03/07/18 2234)  famotidine (PEPCID) tablet 20 mg (20 mg Oral Given 03/07/18 2234)     Initial Impression / Assessment and Plan / ED Course  I have reviewed the triage vital signs and the nursing notes.  Pertinent labs & imaging results that were available during my care of the patient were reviewed by me and considered in my medical decision making (see chart for details).     Pt well appearing.  Vitals reviewed.  Airway is patent, no edema of the face, lips or tongue.  No respiratory distress.  Pt has been observed here w/o complication.  Appears safe for d/c home, continued benadryl, steroid and close PCP f/u.  Return precautions discussed.     Final Clinical Impressions(s) / ED Diagnoses   Final diagnoses:  Urticaria    ED Discharge Orders        Ordered    predniSONE (DELTASONE) 10 MG tablet  Status:  Discontinued     03/07/18 2259    predniSONE (DELTASONE) 10 MG tablet     03/07/18 2259       Pauline Ausriplett, Tajai Suder, PA-C 03/09/18 2039    Linwood DibblesKnapp, Jon, MD 03/10/18 [phone]

## 2018-04-02 ENCOUNTER — Emergency Department (HOSPITAL_COMMUNITY)
Admission: EM | Admit: 2018-04-02 | Discharge: 2018-04-03 | Disposition: A | Discharge status: Home / Self Care | Payer: Self-pay | Attending: Emergency Medicine | Admitting: Emergency Medicine

## 2018-04-02 ENCOUNTER — Emergency Department (HOSPITAL_COMMUNITY): Payer: Self-pay

## 2018-04-02 ENCOUNTER — Encounter (HOSPITAL_COMMUNITY): Payer: Self-pay

## 2018-04-02 ENCOUNTER — Other Ambulatory Visit: Payer: Self-pay

## 2018-04-02 DIAGNOSIS — Z7982 Long term (current) use of aspirin: Secondary | ICD-10-CM | POA: Insufficient documentation

## 2018-04-02 DIAGNOSIS — R569 Unspecified convulsions: Secondary | ICD-10-CM | POA: Insufficient documentation

## 2018-04-02 DIAGNOSIS — Z87891 Personal history of nicotine dependence: Secondary | ICD-10-CM | POA: Insufficient documentation

## 2018-04-02 DIAGNOSIS — Z79899 Other long term (current) drug therapy: Secondary | ICD-10-CM | POA: Insufficient documentation

## 2018-04-02 LAB — BASIC METABOLIC PANEL
Anion gap: 9 (ref 5–15)
BUN: 5 mg/dL — AB (ref 6–20)
CHLORIDE: 107 mmol/L (ref 98–111)
CO2: 24 mmol/L (ref 22–32)
CREATININE: 0.88 mg/dL (ref 0.44–1.00)
Calcium: 9 mg/dL (ref 8.9–10.3)
GFR calc Af Amer: >60 mL/min (ref >60)
GFR calc non Af Amer: >60 mL/min (ref >60)
Glucose, Bld: 93 mg/dL (ref 70–99)
POTASSIUM: 3.8 mmol/L (ref 3.5–5.1)
SODIUM: 140 mmol/L (ref 135–145)

## 2018-04-02 LAB — I-STAT BETA HCG BLOOD, ED (MC, WL, AP ONLY): I-stat hCG, quantitative: <5 m[IU]/mL (ref <5)

## 2018-04-02 LAB — CBC
HEMATOCRIT: 40.7 % (ref 36.0–46.0)
Hemoglobin: 13.3 g/dL (ref 12.0–15.0)
MCH: 27.8 pg (ref 26.0–34.0)
MCHC: 32.7 g/dL (ref 30.0–36.0)
MCV: 85 fL (ref 78.0–100.0)
PLATELETS: 322 10*3/uL (ref 150–400)
RBC: 4.79 MIL/uL (ref 3.87–5.11)
RDW: 13.1 % (ref 11.5–15.5)
WBC: 7.9 10*3/uL (ref 4.0–10.5)

## 2018-04-02 LAB — CBG MONITORING, ED
GLUCOSE-CAPILLARY: 86 mg/dL (ref 70–99)
Glucose-Capillary: 87 mg/dL (ref 70–99)

## 2018-04-02 NOTE — ED Triage Notes (Signed)
EMS called out for seizure. Pt boyfriend told EMS that pt yelled out and then fell to the floor and had seizure like activity for approx 2 minutes. Pt cannot recall events. EMS reports pt was post-ictal upon arrival. Pt alert and oriented x 4 at this time. No loss of continence. No injury to tongue/mouth.  No hx of seizures reported.

## 2018-04-02 NOTE — ED Provider Notes (Addendum)
[facility] EMERGENCY DEPARTMENT Provider Note   CSN: [id number] Arrival date & time: 04/02/18  2100     History   Chief Complaint Chief Complaint  Patient presents with  . Seizures    HPI [name] is a 42 y.o. female.  HPI  Patient was sitting on a couch with family members when she suddenly stood up, look to the left, as if she was startled and possibly "had seen a spider."  Shortly after that she began convulsing, which was described as holding elbows flexed and arms tense and shaking her arms.  She began to fall, family members caught her and helped her to the floor.  She continued to convulse for another 10 seconds, then stopped convulsing and appeared to stop breathing.  At that point her husband gave her "CPR," described as a couple of chest compressions and helping her breathe, by blowing into her mouth and letting her exhale.  After about a minute of that she suddenly awoke and was breathing normally.  She was confused, and remained so until EMS got there and then she comes to the ambulance.  After arrival here family members state that she is at her baseline.  No prior history of seizure.  Patient denies collection of events.  She denies headache, tongue pain, chest pain, extremity pain, back pain, preceding symptoms or recent illnesses.  There are no other known modifying factors.   History reviewed. No pertinent past medical history.  There are no active problems to display for this patient.   Past Surgical History:  Procedure Laterality Date  . TUBAL LIGATION       OB History   None      Home Medications    Prior to Admission medications   Medication Sig Start Date End Date Taking? Authorizing Provider  aspirin EC 81 MG tablet Take 81 mg by mouth daily.   Yes [provider]  ibuprofen (ADVIL,MOTRIN) 200 MG tablet Take 200-400 mg by mouth every 6 (six) hours as needed for moderate pain or cramping.   Yes [provider]  omeprazole  (PRILOSEC OTC) 20 MG tablet Take 20 mg by mouth daily.   Yes [provider]  acetaminophen (TYLENOL) 500 MG tablet Take 1,000 mg by mouth as needed. For pain    [provider]    Family History No family history on file.  Social History Social History   Tobacco Use  . Smoking status: Former Smoker    Types: Cigarettes  . Smokeless tobacco: Never Used  Substance Use Topics  . Alcohol use: Yes    Comment: occsionally  . Drug use: No     Allergies   Patient has no known allergies.   Review of Systems Review of Systems  All other systems reviewed and are negative.    Physical Exam Updated Vital Signs BP (!) 156/93   Pulse 66   Resp 14   Ht 5\' 4"  (1.626 m)   Wt 81.6 kg   LMP 03/24/2018 (Approximate)   SpO2 100%   BMI 30.88 kg/m   Physical Exam  Constitutional: She is oriented to person, place, and time. She appears well-developed and well-nourished. No distress.  HENT:  Head: Normocephalic and atraumatic.  Right Ear: External ear normal.  Left Ear: External ear normal.  No tongue or head trauma  Eyes: Pupils are equal, round, and reactive to light. Conjunctivae and EOM are normal.  Neck: Normal range of motion and phonation normal. Neck supple.  Cardiovascular: Normal rate, regular rhythm and normal heart sounds.  Pulmonary/Chest: Effort normal and breath sounds normal. She exhibits no bony tenderness.  Abdominal: Soft. There is no tenderness.  Musculoskeletal: Normal range of motion.  Normal strength arms and legs bilaterally.  Neurological: She is alert and oriented to person, place, and time. No cranial nerve deficit or sensory deficit. She exhibits normal muscle tone. Coordination normal.  Dysarthria, aphasia or nystagmus.  Skin: Skin is warm, dry and intact.  Psychiatric: She has a normal mood and affect. Her behavior is normal. Judgment and thought content normal.  Nursing note and vitals reviewed.    ED Treatments / Results   Labs (all labs ordered are listed, but only abnormal results are displayed) Labs Reviewed  BASIC METABOLIC PANEL - Abnormal; Notable for the following components:      Result Value   BUN 5 (*)    All other components within normal limits  CBC  URINALYSIS, ROUTINE W REFLEX MICROSCOPIC  RAPID URINE DRUG SCREEN, HOSP PERFORMED  CBG MONITORING, ED  I-STAT BETA HCG BLOOD, ED ([facility], [facility], [facility] ONLY)  CBG MONITORING, ED    EKG EKG Interpretation  Date/Time:  Saturday April 02 2018 21:07:47 EDT Ventricular Rate:  87 PR Interval:    QRS Duration: 94 QT Interval:  377 QTC Calculation: 454 R Axis:   23 Text Interpretation:  Sinus rhythm Low voltage, precordial leads No old tracing to compare Confirmed by Mancel Bale [phone]) on 04/02/2018 10:37:37 PM   Radiology Ct Head Wo Contrast  Result Date: 04/02/2018 CLINICAL DATA:  Seizure, new, nontraumatic, >40 yrs EXAM: CT HEAD WITHOUT CONTRAST TECHNIQUE: Contiguous axial images were obtained from the base of the skull through the vertex without intravenous contrast. COMPARISON:  None. FINDINGS: Brain: No intracranial hemorrhage, mass effect, or midline shift. No hydrocephalus. The basilar cisterns are patent. No evidence of territorial infarct or acute ischemia. No extra-axial or intracranial fluid collection. Vascular: No hyperdense vessel. Skull: No fracture or focal lesion. Sinuses/Orbits: Paranasal sinuses and mastoid air cells are clear. The visualized orbits are unremarkable. Other: None. IMPRESSION: Negative head CT. Electronically Signed   By: Rubye Oaks M.D.   On: 04/02/2018 23:14    Procedures .Critical Care Performed by: Mancel Bale, MD Authorized by: Mancel Bale, MD   Critical care provider statement:    Critical care time (minutes):  35   Critical care start time:  04/02/2018 10:30 PM   Critical care end time:  04/03/2018 12:30 AM   Critical care time was exclusive of:  Separately billable procedures and treating other  patients   Critical care was necessary to treat or prevent imminent or life-threatening deterioration of the following conditions:  CNS failure or compromise   Critical care was time spent personally by me on the following activities:  Blood draw for specimens, development of treatment plan with patient or surrogate, discussions with consultants, evaluation of patient's response to treatment, examination of patient, obtaining history from patient or surrogate, ordering and performing treatments and interventions, ordering and review of laboratory studies, pulse oximetry, re-evaluation of patient's condition, review of old charts and ordering and review of radiographic studies   (including critical care time)  Medications Ordered in ED Medications - No data to display   Initial Impression / Assessment and Plan / ED Course  I have reviewed the triage vital signs and the nursing notes.  Pertinent labs & imaging results that were available during my care of the patient were reviewed by me and  considered in my medical decision making (see chart for details).      Patient Vitals for the past 24 hrs:  BP Pulse Resp SpO2 Height Weight  04/02/18 2315 - 66 14 100 % - -  04/02/18 2300 - - 18 - - -  04/02/18 2245 - 67 20 100 % - -  04/02/18 2230 (!) 156/93 68 17 98 % - -  04/02/18 2215 - 63 17 99 % - -  04/02/18 2200 (!) 154/95 67 17 97 % - -  04/02/18 2145 - 69 15 99 % - -  04/02/18 2130 - 72 20 100 % - -  04/02/18 2115 - 83 15 98 % - -  04/02/18 2105 - - - - 5\' 4"  (1.626 m) 81.6 kg    12:25 AM Reevaluation with update and discussion. After initial assessment and treatment, an updated evaluation reveals no seizure in ED.  She remains alert and conversant.  She has not produced a urine sample for testing yet.  Findings discussed with patient all questions answered.  Is comfortable going home and following up with neurology. Mancel BaleElliott Delrae Hagey   Medical Decision Making: Seizure, first time, by  history, without significant findings for lowered seizure threshold.  No injury.  Doubt serious bacterial infection, metabolic instability or impending vascular collapse.  CRITICAL CARE-no Performed by: Mancel BaleElliott Elih Mooney  Nursing Notes Reviewed/ Care Coordinated Applicable Imaging Reviewed Interpretation of Laboratory Data incorporated into ED treatment  Plan: care to oncoming team, to evaluate after urinalysis and discharge with neurology follow-up if remains stable.   Final Clinical Impressions(s) / ED Diagnoses   Final diagnoses:  Seizure [facility](H[facility])    ED Discharge Orders    None       Mancel BaleWentz, Takiya Belmares, MD 04/03/18 [id number]    Mancel BaleWentz, Glenda Spelman, MD 04/14/18 [phone]

## 2018-04-03 ENCOUNTER — Other Ambulatory Visit: Payer: Self-pay

## 2018-04-03 LAB — URINALYSIS, ROUTINE W REFLEX MICROSCOPIC
Bilirubin Urine: NEGATIVE
Glucose, UA: NEGATIVE mg/dL
Ketones, ur: NEGATIVE mg/dL
Leukocytes, UA: NEGATIVE
Nitrite: NEGATIVE
Protein, ur: NEGATIVE mg/dL
SPECIFIC GRAVITY, URINE: 1.004 — AB (ref 1.005–1.030)
pH: 6 (ref 5.0–8.0)

## 2018-04-03 LAB — RAPID URINE DRUG SCREEN, HOSP PERFORMED
AMPHETAMINES: NOT DETECTED
Barbiturates: NOT DETECTED
Benzodiazepines: POSITIVE — AB
COCAINE: NOT DETECTED
OPIATES: NOT DETECTED
TETRAHYDROCANNABINOL: NOT DETECTED

## 2018-04-03 MED ORDER — LEVETIRACETAM IN NACL 1000 MG/100ML IV SOLN
1000.0000 mg | Freq: Once | INTRAVENOUS | Status: AC
  Administered 2018-04-03: 1000 mg via INTRAVENOUS
  Filled 2018-04-03: qty 100

## 2018-04-03 MED ORDER — LORAZEPAM 2 MG/ML IJ SOLN
INTRAMUSCULAR | Status: AC
  Administered 2018-04-03: 0.5 mg
  Filled 2018-04-03: qty 1

## 2018-04-03 MED ORDER — LEVETIRACETAM 500 MG PO TABS: 500.0000 mg | ORAL_TABLET | Freq: Two times a day (BID) | ORAL | 0 refills | Status: DC

## 2018-04-03 NOTE — ED Notes (Signed)
Paramedic Sima Mataserry Stewart contacted due to initial transporting the patient. Paramedic Roseanne RenoStewart stated no medications were given on scene or en-route. Dr. Devoria AlbeIva Knapp notified to to patient's condition.

## 2018-04-03 NOTE — Discharge Instructions (Addendum)
You did have a seizure tonight, probably two from the way your husband describes what happened at home.  Hopefully this is from being sleep deprived but you should follow-up with the neurologist. Take the medication as prescribed.   You need to have further testing that cannot be done in the ED such as a EEG.  NO DRIVING UNTIL CLEARED TO DRIVE BY A NEUROLOGIST OR YOUR PRIMARY CARE DOCTOR. Call Dr Ronal Fearoonquah's office to get an appointment. He is a neurologist. You can also try to get a [facility] doctor [facility] or [facility]. Try to get more sleep. Continue taking the ibuprofen 600 mg and acetaminophen 1000 mg every 6 hrs for pain. Follow up with your dentist office about your dentures.

## 2018-04-03 NOTE — ED Provider Notes (Signed)
Patient left a change of shift to check UA and UDS.  Patient was discharged.  Results for orders placed or performed during the hospital encounter of 04/02/18  Urinalysis, Routine w reflex microscopic  Result Value Ref Range   Color, Urine YELLOW YELLOW   APPearance CLEAR CLEAR   Specific Gravity, Urine 1.004 (L) 1.005 - 1.030   pH 6.0 5.0 - 8.0   Glucose, UA NEGATIVE NEGATIVE mg/dL   Hgb urine dipstick LARGE (A) NEGATIVE   Bilirubin Urine NEGATIVE NEGATIVE   Ketones, ur NEGATIVE NEGATIVE mg/dL   Protein, ur NEGATIVE NEGATIVE mg/dL   Nitrite NEGATIVE NEGATIVE   Leukocytes, UA NEGATIVE NEGATIVE   RBC / HPF 0-5 0 - 5 RBC/hpf   WBC, UA 0-5 0 - 5 WBC/hpf   Bacteria, UA RARE (A) NONE SEEN   Squamous Epithelial / LPF 0-5 0 - 5  Urine rapid drug screen (hosp performed)  Result Value Ref Range   Opiates NONE DETECTED NONE DETECTED   Cocaine NONE DETECTED NONE DETECTED   Benzodiazepines POSITIVE (A) NONE DETECTED   Amphetamines NONE DETECTED NONE DETECTED   Tetrahydrocannabinol NONE DETECTED NONE DETECTED   Barbiturates NONE DETECTED NONE DETECTED       Devoria AlbeKnapp, Judson Tsan, MD 04/03/18 [phone]

## 2018-04-03 NOTE — ED Notes (Signed)
Patient had active tonic clonic seizure that lasted for about  1 minute. Patient turned cyanotic [facility] by [facility] and RN. Patient suctioned and per Dr. Lynelle DoctorKnapp patient was given 0.5 mg's of Ativan. Patient was placed on 4 liters of oxygen by nasal cannula.

## 2018-04-03 NOTE — ED Provider Notes (Signed)
Called to room, pt had tonic clonic seizure activity lasting about 1 minute. Pt given ativan 0.5 mg, is still tight, looking around but not making eye contact. Husband of 20 years states has never seen this before. Her Maternal aunt has epilepsy. Pt bit her tongue. Will observe longer. Started on keppra IV.   Recheck at 3 AM patient is awake and talking.  She states she had some teeth pulled and she has not been sleeping well at night due to pain.  I am wondering if her seizure may be due to sleep deprivation.  She denies taking any benzodiazepines, when they removed her teeth they did local anesthetic.  EMS who transported this patient to the ED was in the ED recently and they deny giving her anything for seizures.  Due to her having a seizure when I was going to discharge her before she was observed in the emergency department longer.  Recheck at 4:30 AM, husband is sleeping but patient is watching TV.  She states she feels fine.  Her Keppra was infused, finished, at 2 AM.  I am thinking patient can be discharged home.  I have added Keppra to take twice a day until she can see the neurologist and get an EEG.  Hopefully she will be able to get some sleep.   Devoria AlbeKnapp, Leanny Moeckel, MD 04/03/18 [phone]

## 2018-07-19 ENCOUNTER — Encounter: Payer: Self-pay | Admitting: Neurology

## 2018-07-19 ENCOUNTER — Telehealth: Payer: Self-pay | Admitting: Neurology

## 2018-07-19 ENCOUNTER — Ambulatory Visit (INDEPENDENT_AMBULATORY_CARE_PROVIDER_SITE_OTHER): Payer: Self-pay | Admitting: Neurology

## 2018-07-19 VITALS — BP 142/90 | HR 58 | Ht 64.0 in | Wt 210.0 lb

## 2018-07-19 DIAGNOSIS — F39 Unspecified mood [affective] disorder: Secondary | ICD-10-CM | POA: Insufficient documentation

## 2018-07-19 DIAGNOSIS — G40209 Localization-related (focal) (partial) symptomatic epilepsy and epileptic syndromes with complex partial seizures, not intractable, without status epilepticus: Secondary | ICD-10-CM | POA: Insufficient documentation

## 2018-07-19 MED ORDER — LAMOTRIGINE 25 MG PO TABS: 25.0000 mg | ORAL_TABLET | Freq: Every day | ORAL | 0 refills | Status: DC

## 2018-07-19 MED ORDER — LAMOTRIGINE 100 MG PO TABS: 100.0000 mg | ORAL_TABLET | Freq: Two times a day (BID) | ORAL | 11 refills | Status: DC

## 2018-07-19 NOTE — Telephone Encounter (Signed)
[facility] assistance [id number] (exp, 10/27/18) order sent to [facility] they will reach out to the pt to schedule.

## 2018-07-19 NOTE — Patient Instructions (Signed)
Week Lamotrigine Keppra 500  1st week 25mg  twice a day 500mg  twice a day  2nd week 25mg x2 tab twice aday 500mg  twice a day  3rd week 25mg x3tab twice aday 500mg  once every night  4th week 25mg x3 tab twice aday Stop Keppra   Lamotrigine 100mg  tablets 100mg  twice a day

## 2018-07-19 NOTE — Progress Notes (Signed)
PATIENT: Bonnie BlinksShannon C Lopez DOB: 10-Jan-1976  Chief Complaint  Patient presents with  . Seizures    She is here with her husband, Gaynelle AduRobbie (they are currently separated).  She had her first known seizure on 04/02/18 and was treated the the ED.  She was started on Keppra 500mg  BID.  She has not had any further seizures since starting the medication.  However, she has noticed a drastic change in her mood and weight gain.   Marland Kitchen. PCP    Vertis KelchAllen, Debra, NP     HISTORICAL  [name] is a 42 year old female, seen in request by her primary care nurse practitioner Vertis Kelchebra Allen for evaluation of seizure, initial evaluation was on July 19, 2018.  I have reviewed and summarized the emergency room visit on April 02, 2018, she was sitting with her family and watching TV, felt forceful neck movement to the right side, then fell to the floor had tonic-clonic seizure lasting for 4 minutes, stop breathing, husband has to perform CPR, had another seizure at emergency room, had a tongue biting, was given Ativan,  Her maternal aunt had a history of seizure  Personally reviewed CT head on April 02, 2018 that was normal, Laboratory evaluation showed UDS was positive for benzodiazepine, she was given Ativan, cbc was normal, Hg 13.3, BMP  She was given Keppra 500 mg twice daily, complains of worsening mood disorder, agitated, weight gain of 50 pounds over past 4 months,  REVIEW OF SYSTEMS: Full 14 system review of systems performed and notable only for as above All other review of systems were negative.  ALLERGIES: No Known Allergies  HOME MEDICATIONS: Current Outpatient Medications  Medication Sig Dispense Refill  . acetaminophen (TYLENOL) 500 MG tablet Take 1,000 mg by mouth as needed. For pain    . aspirin EC 81 MG tablet Take 81 mg by mouth daily.    Marland Kitchen. ibuprofen (ADVIL,MOTRIN) 200 MG tablet Take 200-400 mg by mouth every 6 (six) hours as needed for moderate pain or cramping.    . levETIRAcetam  (KEPPRA) 500 MG tablet Take 1 tablet (500 mg total) by mouth 2 (two) times daily. 60 tablet 0  . omeprazole (PRILOSEC OTC) 20 MG tablet Take 20 mg by mouth daily.     No current facility-administered medications for this visit.     PAST MEDICAL HISTORY: Past Medical History:  Diagnosis Date  . Acid reflux   . Anxiety and depression   . Full dentures   . Headache   . Seizure (HCC)     PAST SURGICAL HISTORY: Past Surgical History:  Procedure Laterality Date  . TUBAL LIGATION    . WISDOM TOOTH EXTRACTION      FAMILY HISTORY: Family History  Problem Relation Age of Onset  . Hypertension Mother   . Other Father        unsure of medical history  . Stroke Maternal Grandfather   . Colon cancer Maternal Aunt     SOCIAL HISTORY: Social History   Socioeconomic History  . Marital status: Married    Spouse name: Not on file  . Number of children: 5  . Years of education: 9th grade  . Highest education level: Not on file  Occupational History  . Occupation: Unemployed  Social Needs  . Financial resource strain: Not on file  . Food insecurity:    Worry: Not on file    Inability: Not on file  . Transportation needs:    Medical: Not on file  Non-medical: Not on file  Tobacco Use  . Smoking status: Former Smoker    Types: Cigarettes  . Smokeless tobacco: Never Used  Substance and Sexual Activity  . Alcohol use: Yes    Comment: occsionally  . Drug use: No  . Sexual activity: Not on file  Lifestyle  . Physical activity:    Days per week: Not on file    Minutes per session: Not on file  . Stress: Not on file  Relationships  . Social connections:    Talks on phone: Not on file    Gets together: Not on file    Attends religious service: Not on file    Active member of club or organization: Not on file    Attends meetings of clubs or organizations: Not on file    Relationship status: Not on file  . Intimate partner violence:    Fear of current or ex partner: Not  on file    Emotionally abused: Not on file    Physically abused: Not on file    Forced sexual activity: Not on file  Other Topics Concern  . Not on file  Social History Narrative   Lives at home with three of her children.   She has 4 biological children and 1 stepchild.   Caffeine use:  1 cup of soda per day.   Right-handed.     PHYSICAL EXAM   Vitals:   07/19/18 1021  BP: (!) 142/90  Pulse: (!) 58  Weight: 210 lb (95.3 kg)  Height: 5\' 4"  (1.626 m)    Not recorded      Body mass index is 36.05 kg/m.  PHYSICAL EXAMNIATION:  Gen: NAD, conversant, well nourised, obese, well groomed                     Cardiovascular: Regular rate rhythm, no peripheral edema, warm, nontender. Eyes: Conjunctivae clear without exudates or hemorrhage Neck: Supple, no carotid bruits. Pulmonary: Clear to auscultation bilaterally   NEUROLOGICAL EXAM:  MENTAL STATUS: Speech:    Speech is normal; fluent and spontaneous with normal comprehension.  Cognition:     Orientation to time, place and person     Normal recent and remote memory     Normal Attention span and concentration     Normal Language, naming, repeating,spontaneous speech     Fund of knowledge   CRANIAL NERVES: CN II: Visual fields are full to confrontation. Fundoscopic exam is normal with sharp discs and no vascular changes. Pupils are round equal and briskly reactive to light. CN III, IV, VI: extraocular movement are normal. No ptosis. CN V: Facial sensation is intact to pinprick in all 3 divisions bilaterally. Corneal responses are intact.  CN VII: Face is symmetric with normal eye closure and smile. CN VIII: Hearing is normal to rubbing fingers CN IX, X: Palate elevates symmetrically. Phonation is normal. CN XI: Head turning and shoulder shrug are intact CN XII: Tongue is midline with normal movements and no atrophy.  MOTOR: There is no pronator drift of out-stretched arms. Muscle bulk and tone are normal. Muscle  strength is normal.  REFLEXES: Reflexes are 2+ and symmetric at the biceps, triceps, knees, and ankles. Plantar responses are flexor.  SENSORY: Intact to light touch, pinprick, positional sensation and vibratory sensation are intact in fingers and toes.  COORDINATION: Rapid alternating movements and fine finger movements are intact. There is no dysmetria on finger-to-nose and heel-knee-shin.    GAIT/STANCE: Posture is normal. Gait  is steady with normal steps, base, arm swing, and turning. Heel and toe walking are normal. Tandem gait is normal.  Romberg is absent.   DIAGNOSTIC DATA (LABS, IMAGING, TESTING) - I reviewed patient records, labs, notes, testing and imaging myself where available.   ASSESSMENT AND PLAN  [name] is a 42 y.o. female   Complex partial seizure with secondary generalization Mood disorder  Complete evaluation with MRI of the brain with without contrast  EEG  Stop Keppra, changed lamotrigine titrating to 100 mg twice a day    Levert Feinstein, M.D. Ph.D.  [facility] [address], [address] [place], [place] [address] Ph: [phone] Fax: [phone]  CC: Vertis Kelch, NP

## 2018-07-31 ENCOUNTER — Other Ambulatory Visit: Payer: Self-pay

## 2018-08-09 ENCOUNTER — Telehealth: Payer: Self-pay | Admitting: Neurology

## 2018-08-09 MED ORDER — LEVETIRACETAM 500 MG PO TABS: 500.0000 mg | ORAL_TABLET | Freq: Two times a day (BID) | ORAL | 1 refills | Status: DC

## 2018-08-09 NOTE — Telephone Encounter (Signed)
The husband called earlier.  Apparently the patient got the prescription for the 100 mg Lamictal tablet and started this taking 300 mg twice daily, they later discovered the mistake and cut back to 100 mg twice daily, but the patient has developed total body itching, swollen lymph nodes in the throat.  No diffuse rashes yet noted.  They are to stop the Lamictal, once the rash goes away they are to restart using the 25 mg tablets and follow the directions on the bottle.  For now, we will go back to the Keppra in full dose taking 500 mg twice daily.  The patient was taken off of Keppra secondary to an underlying issue with anxiety.  A prescription for the Keppra was sent in.

## 2018-08-10 ENCOUNTER — Emergency Department (HOSPITAL_COMMUNITY)
Admission: EM | Admit: 2018-08-10 | Discharge: 2018-08-11 | Disposition: A | Discharge status: Home / Self Care | Payer: Self-pay | Attending: Emergency Medicine | Admitting: Emergency Medicine

## 2018-08-10 DIAGNOSIS — Z7982 Long term (current) use of aspirin: Secondary | ICD-10-CM | POA: Insufficient documentation

## 2018-08-10 DIAGNOSIS — T7840XA Allergy, unspecified, initial encounter: Secondary | ICD-10-CM | POA: Insufficient documentation

## 2018-08-10 DIAGNOSIS — Z79899 Other long term (current) drug therapy: Secondary | ICD-10-CM | POA: Insufficient documentation

## 2018-08-10 DIAGNOSIS — Z87891 Personal history of nicotine dependence: Secondary | ICD-10-CM | POA: Insufficient documentation

## 2018-08-10 NOTE — ED Triage Notes (Signed)
Pt C/O itching and burning all over her body. Pt denies SOB or oral swelling.

## 2018-08-11 ENCOUNTER — Other Ambulatory Visit: Payer: Self-pay

## 2018-08-11 ENCOUNTER — Encounter (HOSPITAL_COMMUNITY): Payer: Self-pay | Admitting: Emergency Medicine

## 2018-08-11 MED ORDER — PREDNISONE 10 MG PO TABS: 20.0000 mg | ORAL_TABLET | Freq: Two times a day (BID) | ORAL | 0 refills | Status: DC

## 2018-08-11 MED ORDER — METHYLPREDNISOLONE SODIUM SUCC 125 MG IJ SOLR
125.0000 mg | Freq: Once | INTRAMUSCULAR | Status: AC
  Administered 2018-08-11: 125 mg via INTRAMUSCULAR
  Filled 2018-08-11: qty 2

## 2018-08-11 MED ORDER — MECLIZINE HCL 25 MG PO TABS: 25.0000 mg | ORAL_TABLET | Freq: Three times a day (TID) | ORAL | 0 refills | Status: DC | PRN

## 2018-08-11 MED ORDER — HYDROXYZINE HCL 25 MG PO TABS
25.0000 mg | ORAL_TABLET | Freq: Once | ORAL | Status: AC
  Administered 2018-08-11: 25 mg via ORAL
  Filled 2018-08-11: qty 1

## 2018-08-11 MED ORDER — HYDROXYZINE HCL 25 MG PO TABS: 25.0000 mg | ORAL_TABLET | Freq: Four times a day (QID) | ORAL | 0 refills | Status: DC

## 2018-08-11 NOTE — Discharge Instructions (Addendum)
Redness on as prescribed.  Hydroxyzine as prescribed as needed for itching.  Return to the emergency department for difficulty breathing, throat swelling, chest pain, or other new and concerning symptoms.

## 2018-08-11 NOTE — ED Provider Notes (Signed)
[facility] EMERGENCY DEPARTMENT Provider Note   CSN: [id number] Arrival date & time: 08/10/18  2353     History   Chief Complaint Chief Complaint  Patient presents with  . Pruritis    HPI [name] is a 43 y.o. female.  Patient is a 43 year old female with history of seizures, anxiety, depression.  She presents today for evaluation of rash and itching.  She was previously taking Keppra, however her doctor wanted to change this to Lamictal as they thought this may help control her anxiety.  She began taking this medication several days ago and is now having a rash and itching to her torso, arms, and legs.  She denies any oral swelling.  She denies any difficulty breathing or swallowing.  The history is provided by the patient.    Past Medical History:  Diagnosis Date  . Acid reflux   . Anxiety and depression   . Full dentures   . Headache   . Seizure [facility])     Patient Active Problem List   Diagnosis Date Noted  . Partial symptomatic epilepsy with complex partial seizures, not intractable, without status epilepticus (HCC) 07/19/2018  . Mood disorder (HCC) 07/19/2018    Past Surgical History:  Procedure Laterality Date  . TUBAL LIGATION    . WISDOM TOOTH EXTRACTION       OB History   No obstetric history on file.      Home Medications    Prior to Admission medications   Medication Sig Start Date End Date Taking? Authorizing Provider  acetaminophen (TYLENOL) 500 MG tablet Take 1,000 mg by mouth as needed. For pain    [provider]  aspirin EC 81 MG tablet Take 81 mg by mouth daily.    [provider]  ibuprofen (ADVIL,MOTRIN) 200 MG tablet Take 200-400 mg by mouth every 6 (six) hours as needed for moderate pain or cramping.    [provider]  lamoTRIgine (LAMICTAL) 100 MG tablet Take 1 tablet (100 mg total) by mouth 2 (two) times daily. 07/19/18   Levert Feinstein, MD  lamoTRIgine (LAMICTAL) 25 MG tablet Take 1 tablet (25 mg total)  by mouth daily. 1 tablet twice a day for the first week 2 tablets twice a day for the second week 3 tablets twice a day for the third week 4 tablets twice a day for the fourth week  For total of 140 tablets  After finish titration with small dose of lamotrigine 25 mg, change to lamotrigine 100 mg twice a day 07/19/18   Levert Feinstein, MD  levETIRAcetam (KEPPRA) 500 MG tablet Take 1 tablet (500 mg total) by mouth 2 (two) times daily. 08/09/18   York Spaniel, MD  omeprazole (PRILOSEC OTC) 20 MG tablet Take 20 mg by mouth daily.    [provider]    Family History Family History  Problem Relation Age of Onset  . Hypertension Mother   . Other Father        unsure of medical history  . Stroke Maternal Grandfather   . Colon cancer Maternal Aunt     Social History Social History   Tobacco Use  . Smoking status: Former Smoker    Types: Cigarettes  . Smokeless tobacco: Never Used  Substance Use Topics  . Alcohol use: Yes    Comment: occsionally  . Drug use: No     Allergies   Patient has no known allergies.   Review of Systems Review of Systems  All  other systems reviewed and are negative.    Physical Exam Updated Vital Signs BP (!) 144/92 (BP Location: Right Arm)   Pulse 74   Temp 98.9 F (37.2 C) (Oral)   Resp 18   Ht 5\' 4"  (1.626 m)   Wt 95.3 kg   LMP 08/01/2018   SpO2 96%   BMI 36.05 kg/m   Physical Exam Vitals signs and nursing note reviewed.  Constitutional:      General: She is not in acute distress.    Appearance: She is well-developed. She is not diaphoretic.  HENT:     Head: Normocephalic and atraumatic.  Neck:     Musculoskeletal: Normal range of motion and neck supple.  Cardiovascular:     Rate and Rhythm: Normal rate and regular rhythm.     Heart sounds: No murmur. No friction rub. No gallop.   Pulmonary:     Effort: Pulmonary effort is normal. No respiratory distress.     Breath sounds: Normal breath sounds. No wheezing.    Abdominal:     General: Bowel sounds are normal. There is no distension.     Palpations: Abdomen is soft.     Tenderness: There is no abdominal tenderness.  Musculoskeletal: Normal range of motion.  Skin:    General: Skin is warm and dry.     Findings: Rash present.     Comments: There is a macular rash noted to the torso and all 4 extremities.  Neurological:     Mental Status: She is alert and oriented to person, place, and time.      ED Treatments / Results  Labs (all labs ordered are listed, but only abnormal results are displayed) Labs Reviewed - No data to display  EKG None  Radiology No results found.  Procedures Procedures (including critical care time)  Medications Ordered in ED Medications  methylPREDNISolone sodium succinate (SOLU-MEDROL) 125 mg/2 mL injection 125 mg (has no administration in time range)  hydrOXYzine (ATARAX/VISTARIL) tablet 25 mg (has no administration in time range)     Initial Impression / Assessment and Plan / ED Course  I have reviewed the triage vital signs and the nursing notes.  Pertinent labs & imaging results that were available during my care of the patient were reviewed by me and considered in my medical decision making (see chart for details).  Patient presents here with possible reaction to Lamictal.  She was recently changed to this medicine from Keppra, now has a rash and itching.  She was given Solu-Medrol and hydroxyzine with good results.  There is no evidence for anaphylaxis and the patient is very comfortable.  I will prescribe prednisone and hydroxyzine which she can take for the next 2 days until the Lamictal is gone from her system.  She understands to return if symptoms worsen or change.  Final Clinical Impressions(s) / ED Diagnoses   Final diagnoses:  None    ED Discharge Orders    None       Geoffery Lyonselo, Dorrene Bently, MD 08/11/18 0120

## 2018-08-18 ENCOUNTER — Ambulatory Visit
Admission: RE | Admit: 2018-08-18 | Discharge: 2018-08-18 | Disposition: A | Discharge status: Home / Self Care | Payer: Self-pay | Source: Ambulatory Visit | Attending: Neurology | Admitting: Neurology

## 2018-08-18 DIAGNOSIS — G40209 Localization-related (focal) (partial) symptomatic epilepsy and epileptic syndromes with complex partial seizures, not intractable, without status epilepticus: Secondary | ICD-10-CM

## 2018-08-25 ENCOUNTER — Telehealth: Payer: Self-pay | Admitting: Neurology

## 2018-08-25 MED ORDER — OXCARBAZEPINE 300 MG PO TABS: 300.0000 mg | ORAL_TABLET | Freq: Two times a day (BID) | ORAL | 11 refills | Status: DC

## 2018-08-25 MED ORDER — ALPRAZOLAM 1 MG PO TABS: ORAL_TABLET | ORAL | 0 refills | Status: DC

## 2018-08-25 NOTE — Addendum Note (Signed)
Addended by: Lilla ShookKIRKMAN, MICHELLE C on: 08/25/2018 03:35 PM   Modules accepted: Orders

## 2018-08-25 NOTE — Telephone Encounter (Addendum)
Returned call to patient's ex-husband, Bonnie [place]Robert Lopez ([place]    1)  The patient was unable to go through with the MRI brain due to anxiety.  Per vo by Dr. Terrace ArabiaYan, provide rx for Xanax 1mg , take 1-2 tablets thirty minutes prior to MRI.  May take one additional tablet before entering scanner, if needed.  Must have driver.  Additionally, it will be helpful for her to be in an open scanner.  2) The patient developed a rash during her Lamictal titration.  She presented to the ED and the medication was discontinued.  She was placed back on Keppra 500mg , one tablet BID.  [name] states her mood has worsened since being back on the Keppra at this dosage.  The patient does not have insurance and will have to pay cash for her prescriptions.  Per vo by Dr. Terrace ArabiaYan, provide rx for oxcarbazepine 300mg , BID. I [place] pharmacy [place] [place] and the cost of the medication is $9.00 for #60.  She should start the oxcarbazepine 300mg , BID right away.  She should continue Keppra 500mg , BID for one week then decrease to Keppra 500mg , one tablet QHS for one week then stop.  I called [name] again.  We reviewed this plan in detail and he wrote it down.  He verbalized understanding then read it back to me correctly.

## 2018-08-25 NOTE — Addendum Note (Signed)
Addended by: Levert Feinstein on: 08/25/2018 04:32 PM   Modules accepted: Orders

## 2018-08-25 NOTE — Addendum Note (Signed)
Addended by: Lindell Spar C on: 08/25/2018 03:44 PM   Modules accepted: Orders

## 2018-08-25 NOTE — Telephone Encounter (Signed)
Pt husband(on [phone]) has called to make RN aware that when pt went for MRI she had a major panic attack and [facility] is suggesting something be given to pt to take before, he said they even suggested putting her to sleep because of pt's extreme reaction to being put in MRI machine.  Pt husband is asking for a call from RN Marcelino Duster to also discuss concerns about pt's reaction to medication.  Please call pt on work # between hours of 1-5 this afternoon

## 2018-08-25 NOTE — Addendum Note (Signed)
Addended by: Lindell Spar C on: 08/25/2018 03:26 PM   Modules accepted: Orders

## 2018-08-31 ENCOUNTER — Ambulatory Visit: Payer: Self-pay

## 2018-09-01 ENCOUNTER — Encounter: Payer: Self-pay | Admitting: Neurology

## 2018-09-07 ENCOUNTER — Telehealth: Payer: Self-pay | Admitting: Neurology

## 2018-09-07 NOTE — Telephone Encounter (Signed)
I spoke to the patient and informed her I will send the order to [facility] for the open MRI machine. I also gave her their number [phone] to give them a call if she has not heard from them by Friday.

## 2018-09-07 NOTE — Telephone Encounter (Signed)
Pts husband Molly Maduro (on [place]) called wanting to know what the update was for the rescheduling of pts MRI. Please advise.

## 2018-09-07 NOTE — Telephone Encounter (Signed)
The patient was unable to go through with the MRI brain due to anxiety.  Per vo by Dr. Terrace Arabia, provide rx for Xanax 1mg , take 1-2 tablets thirty minutes prior to MRI.  May take one additional tablet before entering scanner, if needed.  Must have driver.  Additionally, it will be helpful for her to be in an open scanner.

## 2018-09-08 NOTE — Telephone Encounter (Signed)
Pt has returned call to [facility] and she says yes to larger unit and the Xanax

## 2018-09-08 NOTE — Telephone Encounter (Signed)
[name] at [facility] emailed me and informed me that the do not accept [facility] assistance and the patient will have to go some where else...   Known of the hospitals have Open MRI's. I lvm for patient to call me back to ask her if she will be okay with going back to [facility] because they have that larger unit along with taking the Xanax.

## 2018-09-08 NOTE — Telephone Encounter (Signed)
I spoke to the patient and informed her [facility] does not take [facility] assistance and will have to go back to GI. I informed her when they contact her to tell her that she would like the largest MRI and used the Xanax as prescrbed.

## 2018-09-18 ENCOUNTER — Ambulatory Visit
Admission: RE | Admit: 2018-09-18 | Discharge: 2018-09-18 | Disposition: A | Discharge status: Home / Self Care | Payer: No Typology Code available for payment source | Source: Ambulatory Visit | Attending: Neurology | Admitting: Neurology

## 2018-09-18 DIAGNOSIS — G40209 Localization-related (focal) (partial) symptomatic epilepsy and epileptic syndromes with complex partial seizures, not intractable, without status epilepticus: Secondary | ICD-10-CM

## 2018-09-18 MED ORDER — GADOBENATE DIMEGLUMINE 529 MG/ML IV SOLN
18.0000 mL | Freq: Once | INTRAVENOUS | Status: AC | PRN
  Administered 2018-09-18: 18 mL via INTRAVENOUS

## 2018-09-21 ENCOUNTER — Telehealth: Payer: Self-pay | Admitting: *Deleted

## 2018-09-21 ENCOUNTER — Telehealth: Payer: Self-pay | Admitting: Neurology

## 2018-09-21 NOTE — Telephone Encounter (Signed)
Pt said since her MRI was normal should she continue taking seizure medication.  She also said [facility] financial aid runs out the end of February. She has not reapplied yet but will. Her EEG is scheduled in June. She will not be able to afford it and is wanting to know if it could be in February. Please call to advise

## 2018-09-21 NOTE — Telephone Encounter (Addendum)
Attempted to reach patient by phone.  It rang repeatedly and eventually went to a message stating "please enter your remote access code".  Hopefully, the patient will see a missed call.

## 2018-09-21 NOTE — Telephone Encounter (Signed)
-----   Message from Levert Feinstein, MD sent at 09/21/2018  9:15 AM EST ----- Please call pt for no significant abnormalities on MRI brain

## 2018-09-21 NOTE — Telephone Encounter (Signed)
The patient no showed her EEG scheduled on 08/31/2017.  She is calling today to get it rescheduled.  She provided an update that her [facility] financial assistance is actually good through 10/27/2018.  She is in the process of getting it extended. However, she would like to have her EEG prior to the 19th, if possible.  The phone room added her to a wait list.   I did speak with her about the importance of getting her financial assistance renewed.  She will need to be able to come to her follow up appts in order to be monitored appropriately.  This allows [place] to be able to continue prescribing her medications safely.  She verbalized understanding.  She did mention that the application was a lot of work and she was unsure how to contact the IRS for information.  I did a search on the internet and provided her with two contact numbers for the IRS.  I again expressed the importance of her being able to keep her follow up appts with our office.

## 2018-09-21 NOTE — Telephone Encounter (Signed)
I was able to speak with her son, Jilda Panda (on [place]).  He is aware of his mother's MRI results and will provide them to her.  He was given our number for her to call back, if she has any questions.

## 2018-09-21 NOTE — Telephone Encounter (Signed)
Pt called back, [facility] financial aid runs out 3/19. She has been added to wait list to try to get her in prior to 3/19. FYI

## 2018-09-21 NOTE — Telephone Encounter (Signed)
Spoke to patient and she is aware her MRI brain showed no significant abnormalities.  She is reapplying for her [facility] financial aid assistance.  She has a pending EEG on 10/31/2018 and a follow up visit pending 01/18/2019.  She plans to keep these appointments.  She will call us back if she has any difficulty with her financial aid being extended.  She understands to continue taking her medications, as prescribed.  She verbalized understanding.

## 2018-10-31 ENCOUNTER — Encounter

## 2018-10-31 ENCOUNTER — Other Ambulatory Visit: Payer: Self-pay

## 2018-11-22 IMAGING — CT CT HEAD W/O CM
3 series · 15 of 46 positions shown, 18 images · non-contrast
Comparison: None.

CLINICAL DATA: Seizure, new, nontraumatic, >40 yrs

EXAM:
CT HEAD WITHOUT CONTRAST
TECHNIQUE: Contiguous axial images were obtained from the base of the skull
through the vertex without intravenous contrast.

[Series 2: head trauma wo · axial · 0.43mm/px · z∈[+1426,+1546]mm · 9 of 29 slices shown, 12 images]
[im 3/29  brain]
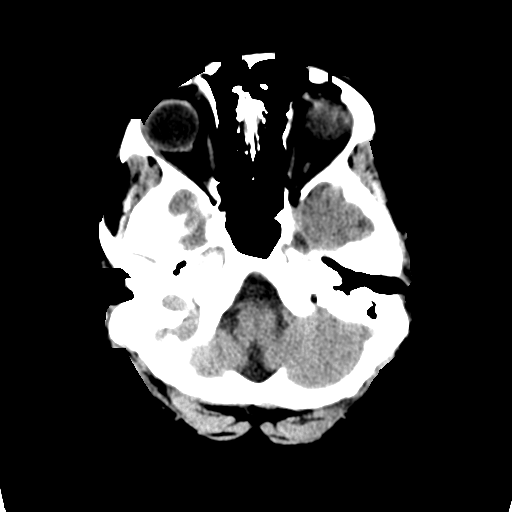
[im 3/29  bone]
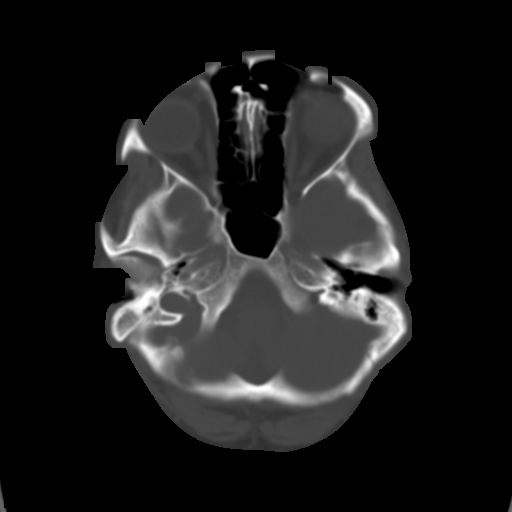
[im 6/29  brain]
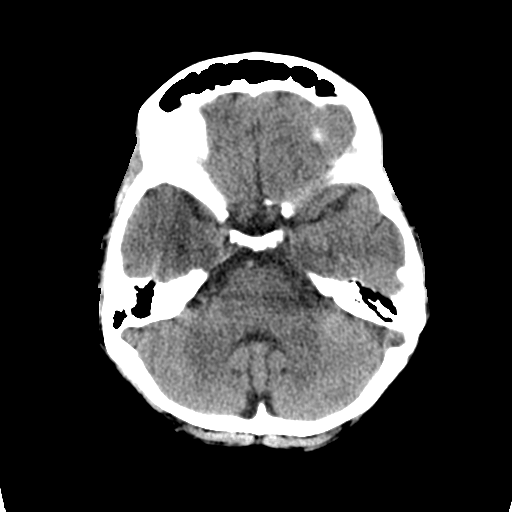
[im 9/29  brain]
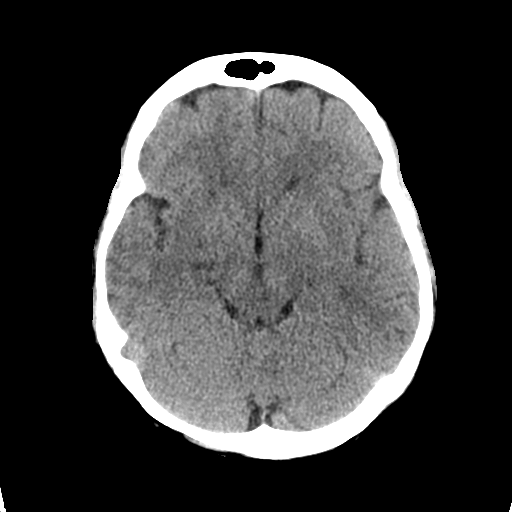
[im 12/29  brain]
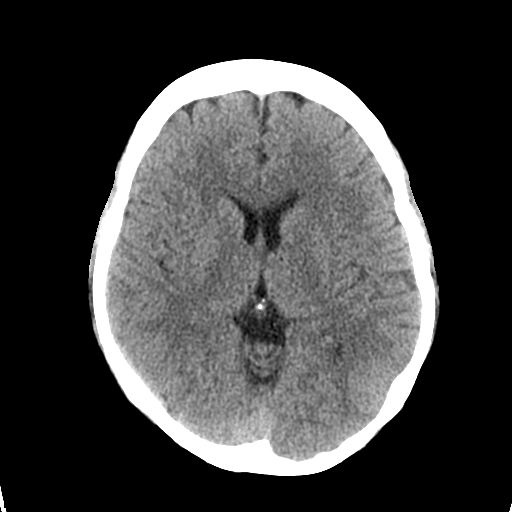
[im 15/29  brain]
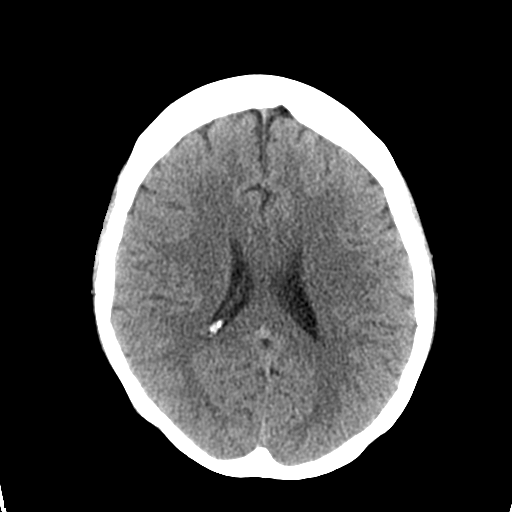
[im 15/29  bone]
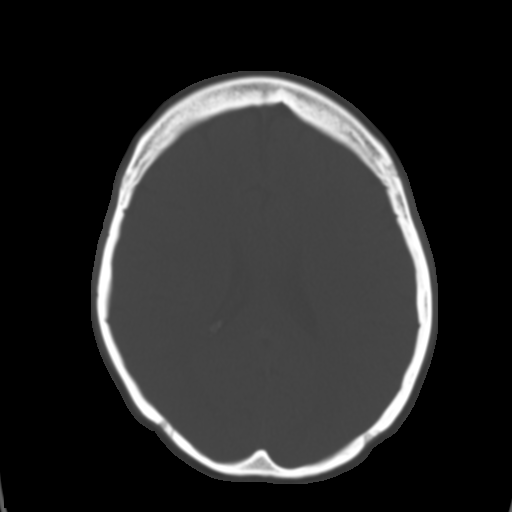
[im 18/29  brain]
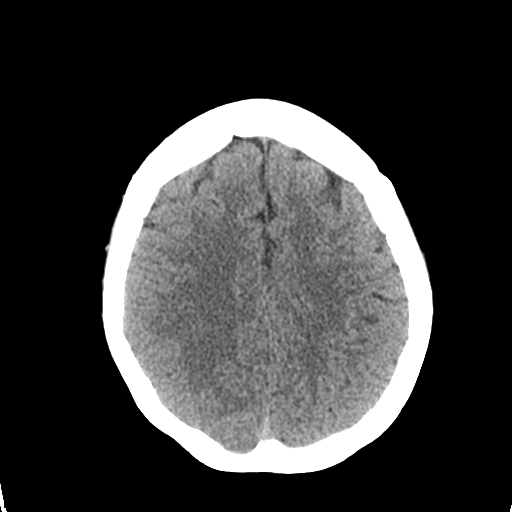
[im 21/29  brain]
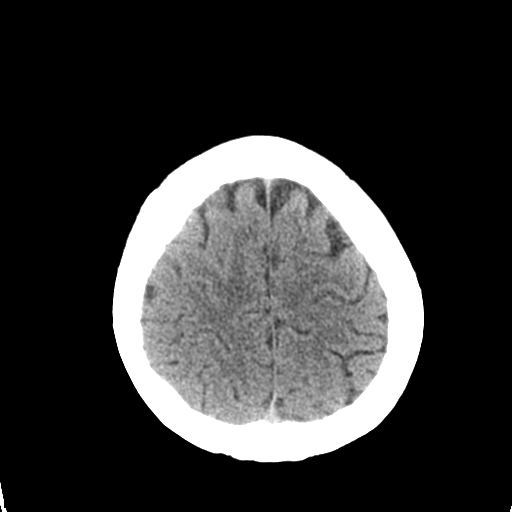
[im 24/29  brain]
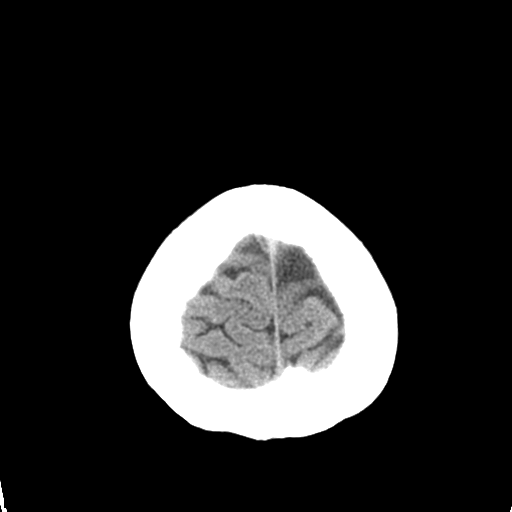
[im 27/29  brain]
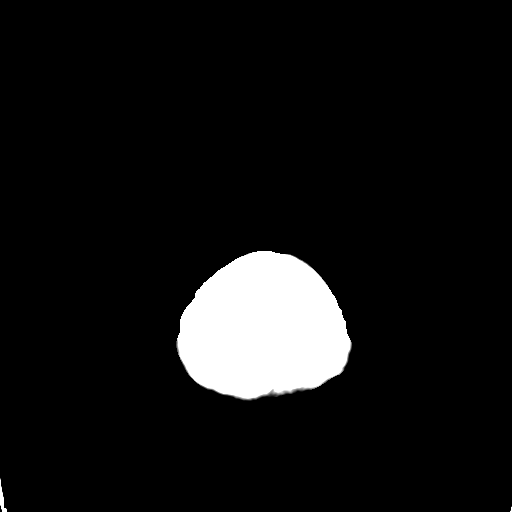
[im 27/29  bone]
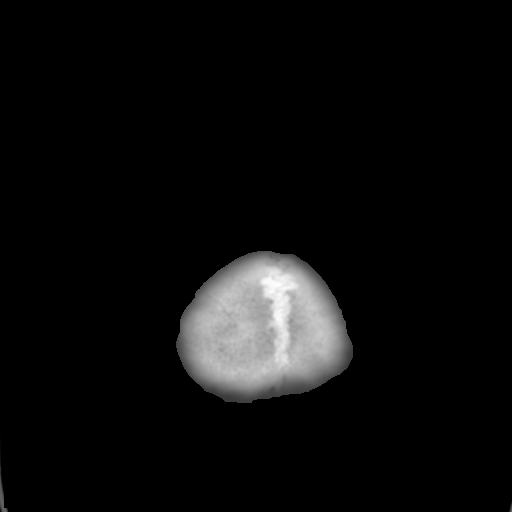

[Series 4: coronal soft tissue · coronal · 0.30mm/px · 3 of 62 slices shown]
[im 21/62  brain]
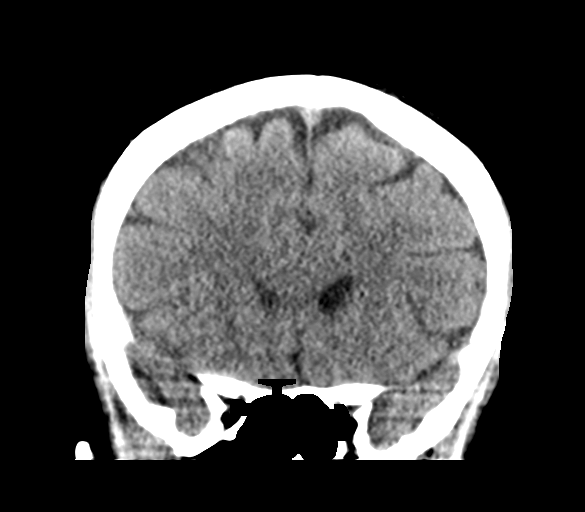
[im 28/62  brain]
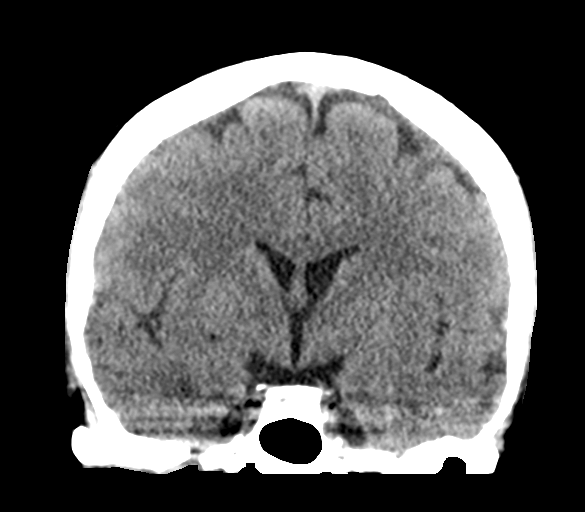
[im 34/62  brain]
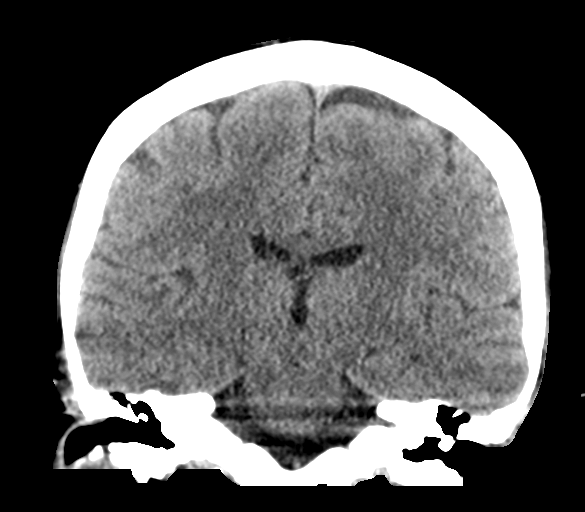

[Series 5: sagittal soft tissue · sagittal · 0.30mm/px · 3 of 54 slices shown]
[im 18/54  brain]
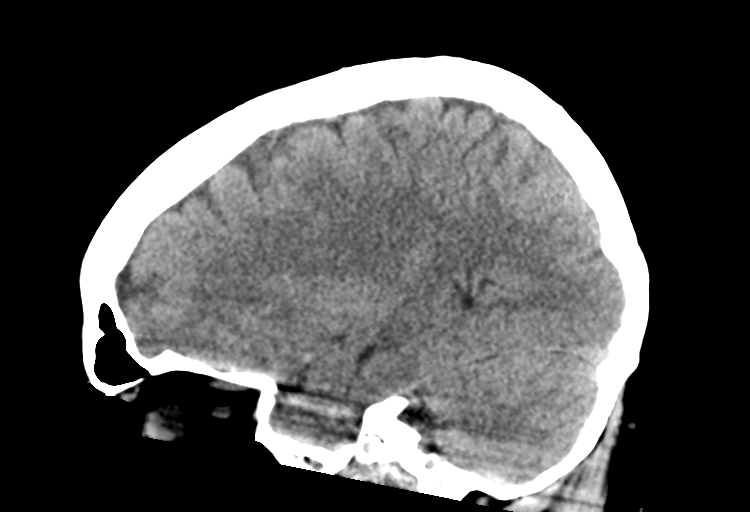
[im 27/54  brain]
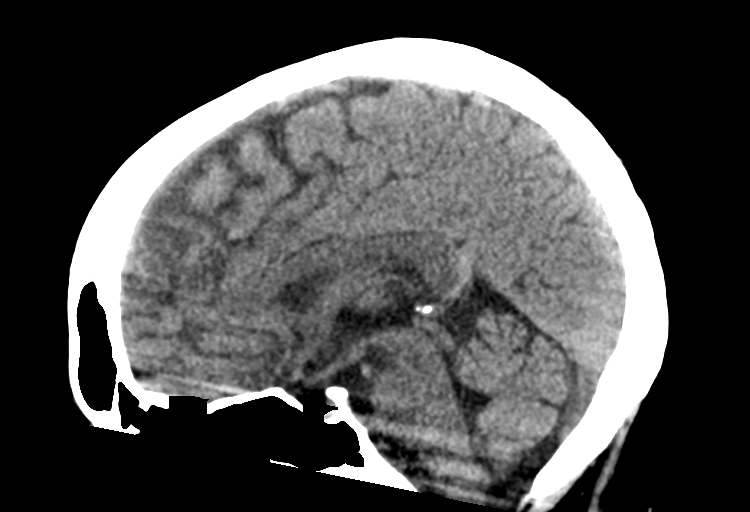
[im 36/54  brain]
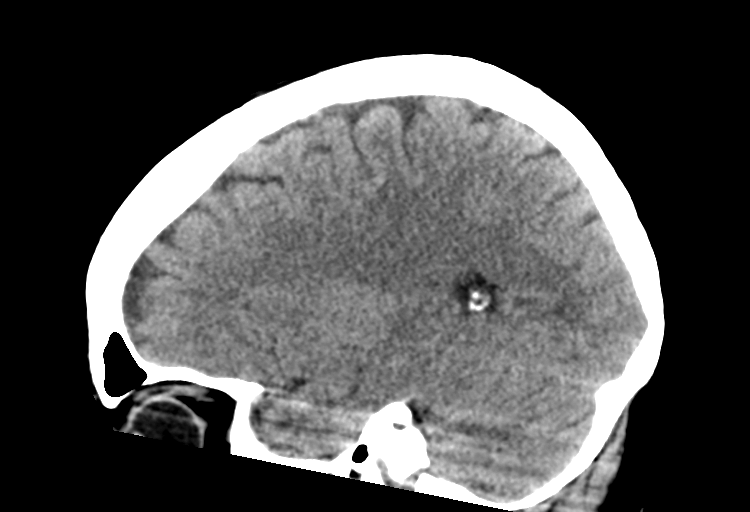

[15 of 46 positions shown; findings below may reference images not displayed]

FINDINGS: Brain: No intracranial hemorrhage, mass effect, or midline shift. No
hydrocephalus. The basilar cisterns are patent. No evidence of
territorial infarct or acute ischemia. No extra-axial or
intracranial fluid collection.

Vascular: No hyperdense vessel.

Skull: No fracture or focal lesion.

Sinuses/Orbits: Paranasal sinuses and mastoid air cells are clear.
The visualized orbits are unremarkable.

Other: None.
IMPRESSION: Negative head CT.

## 2019-01-18 ENCOUNTER — Ambulatory Visit: Payer: Self-pay | Admitting: Neurology

## 2019-03-19 NOTE — Progress Notes (Deleted)
PATIENT: [name] DOB: 07/22/1976  REASON FOR VISIT: follow up HISTORY FROM: patient  HISTORY OF PRESENT ILLNESS: Today 03/19/19  HISTORY [name] is a 43 year old female, seen in request by her primary care nurse practitioner Dionisio Paschal for evaluation of seizure, initial evaluation was on July 19, 2018.  I have reviewed and summarized the emergency room visit on April 02, 2018, she was sitting with her family and watching TV, felt forceful neck movement to the right side, then fell to the floor had tonic-clonic seizure lasting for 4 minutes, stop breathing, husband has to perform CPR, had another seizure at emergency room, had a tongue biting, was given Ativan,  Her maternal aunt had a history of seizure  Personally reviewed CT head on April 02, 2018 that was normal, Laboratory evaluation showed UDS was positive for benzodiazepine, she was given Ativan, cbc was normal, Hg 13.3, BMP  She was given Keppra 500 mg twice daily, complains of worsening mood disorder, agitated, weight gain of 50 pounds over past 4 months,  Update March 20, 4583 SS: 43 year old, initial seizure in August 2019, switched off Keppra at last visit, over to lamotrigine 100 mg twice a day.  MRI of the brain in February 2020 showed no significant abnormality.  She mistakenly took the wrong dose of Lamictal, developed a rash.  She went to the ER, was started back on Keppra.  In January 2020, Dr. Krista Blue switched to oxycarbazepine 300 mg twice daily.  Her EEG is pending.  REVIEW OF SYSTEMS: Out of a complete 14 system review of symptoms, the patient complains only of the following symptoms, and all other reviewed systems are negative.  ALLERGIES: Allergies  Allergen Reactions  . Lamictal [Lamotrigine] Rash    HOME MEDICATIONS: Outpatient Medications Prior to Visit  Medication Sig Dispense Refill  . acetaminophen (TYLENOL) 500 MG tablet Take 1,000 mg by mouth as needed. For pain    .  ALPRAZolam (XANAX) 1 MG tablet Take 1-2 tablets thirty minutes prior to MRI.  May repeat with 1 additional tablet prior to entering scanner, if needed.  Must have driver. 3 tablet 0  . aspirin EC 81 MG tablet Take 81 mg by mouth daily.    . hydrOXYzine (ATARAX/VISTARIL) 25 MG tablet Take 1 tablet (25 mg total) by mouth every 6 (six) hours. 12 tablet 0  . ibuprofen (ADVIL,MOTRIN) 200 MG tablet Take 200-400 mg by mouth every 6 (six) hours as needed for moderate pain or cramping.    . levETIRAcetam (KEPPRA) 500 MG tablet Take 1 tablet (500 mg total) by mouth 2 (two) times daily. 60 tablet 1  . omeprazole (PRILOSEC OTC) 20 MG tablet Take 20 mg by mouth daily.    . Oxcarbazepine (TRILEPTAL) 300 MG tablet Take 1 tablet (300 mg total) by mouth 2 (two) times daily. 60 tablet 11  . predniSONE (DELTASONE) 10 MG tablet Take 2 tablets (20 mg total) by mouth 2 (two) times daily with a meal. 12 tablet 0   No facility-administered medications prior to visit.     PAST MEDICAL HISTORY: Past Medical History:  Diagnosis Date  . Acid reflux   . Anxiety and depression   . Full dentures   . Headache   . Seizure ([place])     PAST SURGICAL HISTORY: Past Surgical History:  Procedure Laterality Date  . TUBAL LIGATION    . WISDOM TOOTH EXTRACTION      FAMILY HISTORY: Family History  Problem Relation Age of Onset  .  Hypertension Mother   . Other Father        unsure of medical history  . Stroke Maternal Grandfather   . Colon cancer Maternal Aunt     SOCIAL HISTORY: Social History   Socioeconomic History  . Marital status: Married    Spouse name: Not on file  . Number of children: 5  . Years of education: 9th grade  . Highest education level: Not on file  Occupational History  . Occupation: Unemployed  Social Needs  . Financial resource strain: Not on file  . Food insecurity    Worry: Not on file    Inability: Not on file  . Transportation needs    Medical: Not on file    Non-medical: Not on  file  Tobacco Use  . Smoking status: Former Smoker    Types: Cigarettes  . Smokeless tobacco: Never Used  Substance and Sexual Activity  . Alcohol use: Yes    Comment: occsionally  . Drug use: No  . Sexual activity: Not on file  Lifestyle  . Physical activity    Days per week: Not on file    Minutes per session: Not on file  . Stress: Not on file  Relationships  . Social Musicianconnections    Talks on phone: Not on file    Gets together: Not on file    Attends religious service: Not on file    Active member of club or organization: Not on file    Attends meetings of clubs or organizations: Not on file    Relationship status: Not on file  . Intimate partner violence    Fear of current or ex partner: Not on file    Emotionally abused: Not on file    Physically abused: Not on file    Forced sexual activity: Not on file  Other Topics Concern  . Not on file  Social History Narrative   Lives at home with three of her children.   She has 4 biological children and 1 stepchild.   Caffeine use:  1 cup of soda per day.   Right-handed.      PHYSICAL EXAM  There were no vitals filed for this visit. There is no height or weight on file to calculate BMI.  Generalized: Well developed, in no acute distress   Neurological examination  Mentation: Alert oriented to time, place, history taking. Follows all commands speech and language fluent Cranial nerve II-XII: Pupils were equal round reactive to light. Extraocular movements were full, visual field were full on confrontational test. Facial sensation and strength were normal. Uvula tongue midline. Head turning and shoulder shrug  were normal and symmetric. Motor: The motor testing reveals 5 over 5 strength of all 4 extremities. Good symmetric motor tone is noted throughout.  Sensory: Sensory testing is intact to soft touch on all 4 extremities. No evidence of extinction is noted.  Coordination: Cerebellar testing reveals good  finger-nose-finger and heel-to-shin bilaterally.  Gait and station: Gait is normal. Tandem gait is normal. Romberg is negative. No drift is seen.  Reflexes: Deep tendon reflexes are symmetric and normal bilaterally.   DIAGNOSTIC DATA (LABS, IMAGING, TESTING) - I reviewed patient records, labs, notes, testing and imaging myself where available.  Lab Results  Component Value Date   WBC 7.9 04/02/2018   HGB 13.3 04/02/2018   HCT 40.7 04/02/2018   MCV 85.0 04/02/2018   PLT 322 04/02/2018      Component Value Date/Time   NA 140 04/02/2018 2121  K 3.8 04/02/2018 2121   CL 107 04/02/2018 2121   CO2 24 04/02/2018 2121   GLUCOSE 93 04/02/2018 2121   BUN 5 (L) 04/02/2018 2121   CREATININE 0.88 04/02/2018 2121   CALCIUM 9.0 04/02/2018 2121   GFRNONAA >60 04/02/2018 2121   GFRAA >60 04/02/2018 2121   No results found for: CHOL, HDL, LDLCALC, LDLDIRECT, TRIG, CHOLHDL No results found for: ZOXW9UHGBA1C No results found for: VITAMINB12 No results found for: TSH    ASSESSMENT AND PLAN 43 y.o. year old female  has a past medical history of Acid reflux, Anxiety and depression, Full dentures, Headache, and Seizure (HCC). here with:  1.  Partial complex seizure with secondary generalization 2.  Mood disorder -She is pending EEG   I spent 15 minutes with the patient. 50% of this time was spent   Margie EgeSarah Sybil Shrader, [place]NP-C, DNP 03/19/2019, 11:16 AM [facility] [address], [address] [place], [place] [id number] [phone]

## 2019-03-20 ENCOUNTER — Telehealth: Payer: Self-pay

## 2019-03-20 ENCOUNTER — Ambulatory Visit: Payer: Self-pay | Admitting: Neurology

## 2019-03-20 ENCOUNTER — Encounter: Payer: Self-pay | Admitting: Neurology

## 2019-03-20 NOTE — Telephone Encounter (Signed)
Patient was a no call/no show for their appointment today.   

## 2019-07-17 ENCOUNTER — Other Ambulatory Visit: Payer: Self-pay | Admitting: Neurology

## 2019-07-17 DIAGNOSIS — G40209 Localization-related (focal) (partial) symptomatic epilepsy and epileptic syndromes with complex partial seizures, not intractable, without status epilepticus: Secondary | ICD-10-CM

## 2019-09-08 ENCOUNTER — Other Ambulatory Visit: Payer: Self-pay | Admitting: Neurology

## 2019-09-08 ENCOUNTER — Ambulatory Visit: Payer: Self-pay | Attending: Internal Medicine

## 2019-09-08 ENCOUNTER — Other Ambulatory Visit: Payer: Self-pay

## 2019-09-08 DIAGNOSIS — U071 COVID-19: Secondary | ICD-10-CM | POA: Insufficient documentation

## 2019-09-08 DIAGNOSIS — Z20822 Contact with and (suspected) exposure to covid-19: Secondary | ICD-10-CM

## 2019-09-09 LAB — NOVEL CORONAVIRUS, NAA: SARS-CoV-2, NAA: DETECTED — AB

## 2019-09-10 ENCOUNTER — Other Ambulatory Visit: Payer: Self-pay | Admitting: Physician Assistant

## 2019-09-10 ENCOUNTER — Telehealth: Payer: Self-pay | Admitting: Physician Assistant

## 2019-09-10 NOTE — Telephone Encounter (Signed)
Called to discuss with Erick Blinks about Covid symptoms and the use of bamlanivimab or casirivimab/imdevimab, a monoclonal antibody infusion for those with mild to moderate Covid symptoms and at a high risk of hospitalization. Her chart was pulled given prednisone on her med list. She is no longer taking this and it has been deleted off her med list.   Pt is not qualified for this infusion due to lack of identified risk factors and co-morbid conditions.  Symptoms reviewed as well as criteria for ending isolation.  Symptoms reviewed that would warrant ED/Hospital evaluation as well should her condition worsen. Preventative practices reviewed. Patient verbalized understanding.  Patient Active Problem List   Diagnosis Date Noted  . Partial symptomatic epilepsy with complex partial seizures, not intractable, without status epilepticus (HCC) 07/19/2018  . Mood disorder (HCC) 07/19/2018    Cline Crock PA-C

## 2020-03-19 ENCOUNTER — Other Ambulatory Visit: Payer: Self-pay

## 2020-03-19 ENCOUNTER — Encounter (HOSPITAL_COMMUNITY): Payer: Self-pay | Admitting: Emergency Medicine

## 2020-03-19 ENCOUNTER — Emergency Department (HOSPITAL_COMMUNITY): Payer: Medicaid Other

## 2020-03-19 DIAGNOSIS — Z20822 Contact with and (suspected) exposure to covid-19: Secondary | ICD-10-CM | POA: Insufficient documentation

## 2020-03-19 DIAGNOSIS — Z5321 Procedure and treatment not carried out due to patient leaving prior to being seen by health care provider: Secondary | ICD-10-CM | POA: Diagnosis not present

## 2020-03-19 DIAGNOSIS — R0602 Shortness of breath: Secondary | ICD-10-CM | POA: Insufficient documentation

## 2020-03-19 LAB — SARS CORONAVIRUS 2 BY RT PCR (HOSPITAL ORDER, PERFORMED IN ~~LOC~~ HOSPITAL LAB): SARS Coronavirus 2: NEGATIVE

## 2020-03-19 NOTE — ED Triage Notes (Signed)
Ems reports pt has history of seizures and says is unsure if she had a seizure today.  Reports had a shot of vodka today.    REports has been getting SOB at work.  Says pt feels like she is having panic attacks.  CBG 106, vss per ems.

## 2020-03-19 NOTE — ED Triage Notes (Signed)
Ems reports pt has history of seizures and says is unsure if she had a seizure today.  Reports had a shot of vodka today.    REports has been getting SOB at work.  Says pt feels like she is having panic attacks.  CBG 106, vss per ems. p  Patient denies drinking today when I ask. Pt reports SOB for the past 2 days. NAD noted with oxygen saturation at 100 percent on room air. Breaths are regular and unlabored.

## 2020-03-20 ENCOUNTER — Emergency Department (HOSPITAL_COMMUNITY)
Admission: EM | Admit: 2020-03-20 | Discharge: 2020-03-20 | Disposition: A | Discharge status: Home / Self Care | Payer: Medicaid Other | Attending: Emergency Medicine | Admitting: Emergency Medicine

## 2020-11-08 IMAGING — DX DG CHEST 2V
2 series · 2 of 2 positions shown · non-contrast
Comparison: None.

CLINICAL DATA: Shortness of breath

EXAM:
CHEST - 2 VIEW

[chest pa]
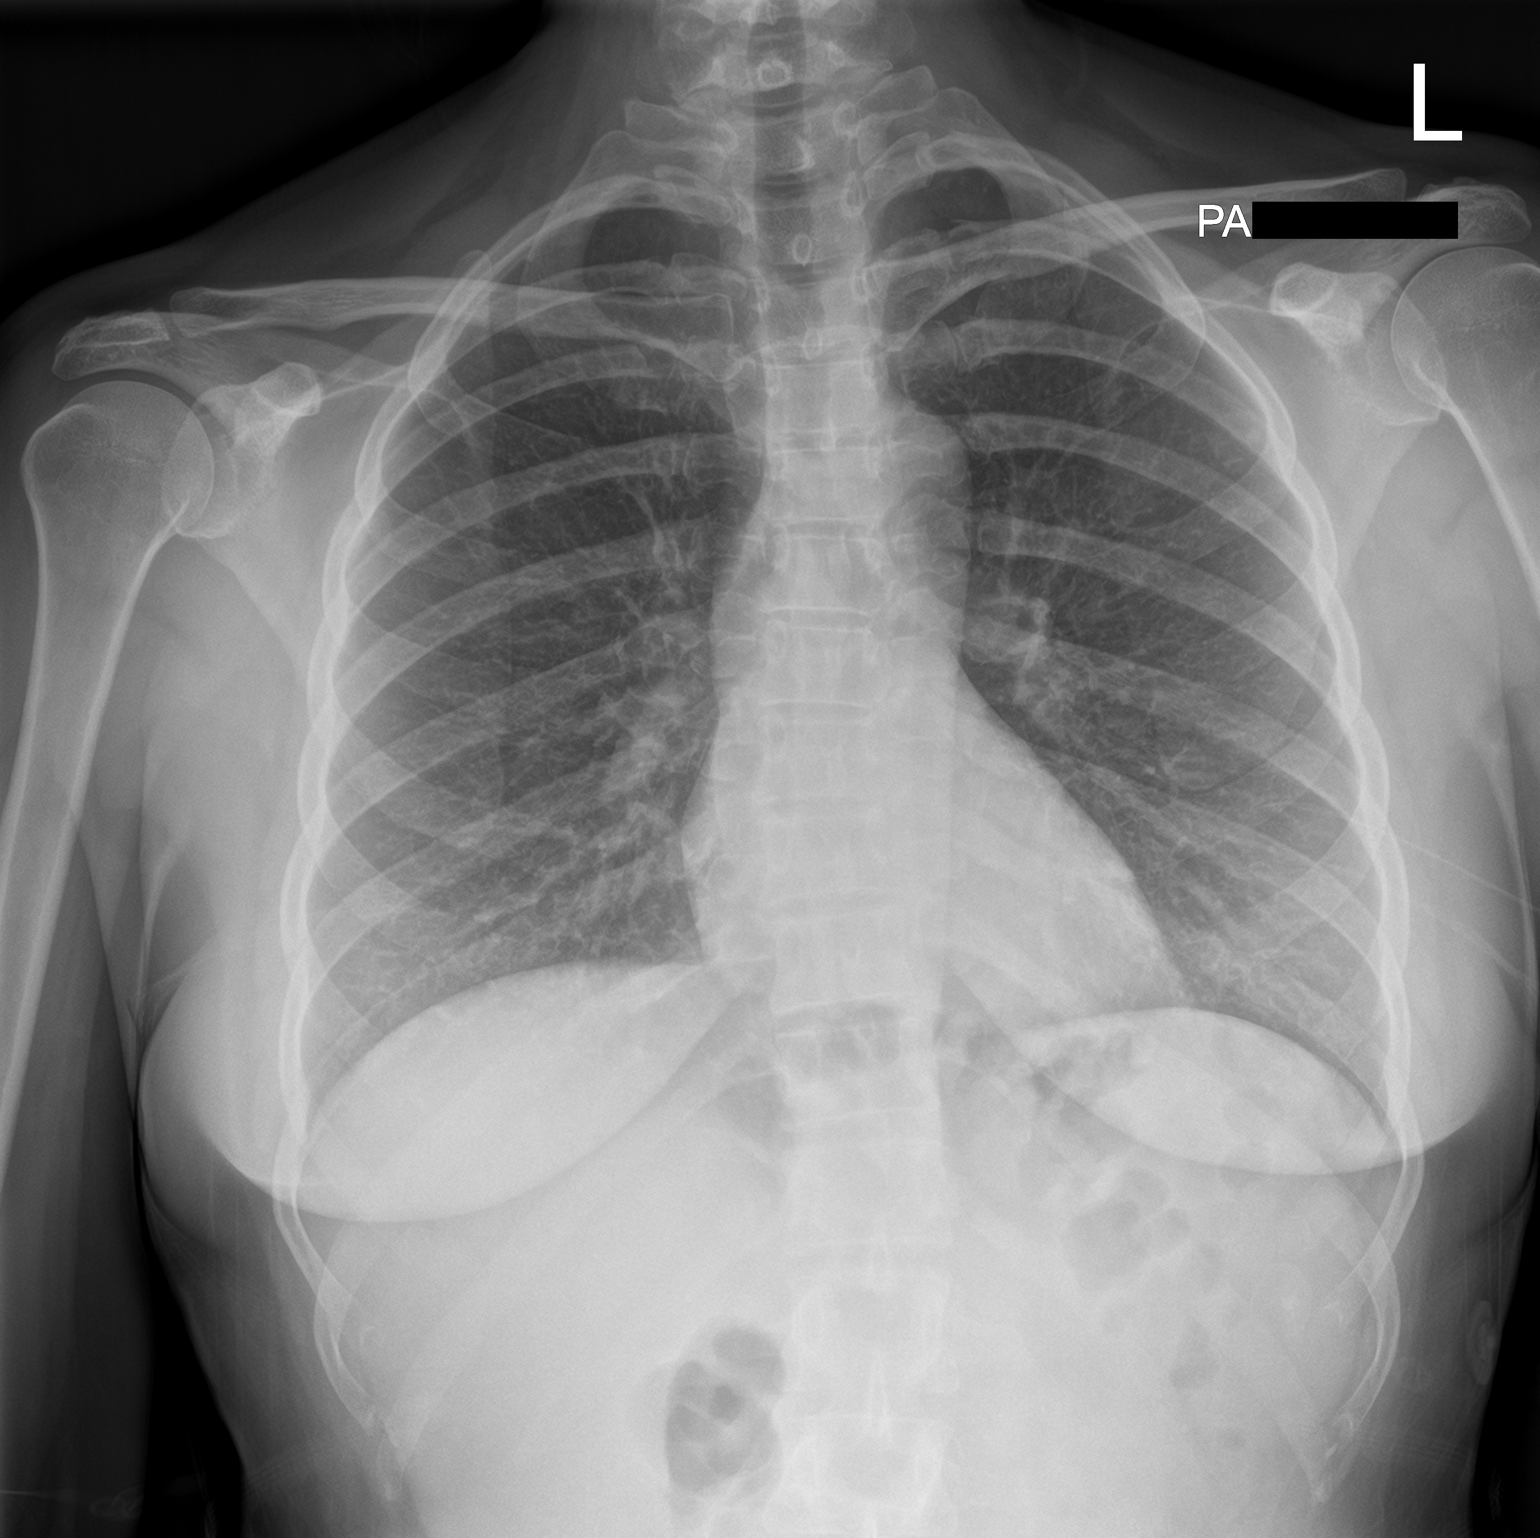

[chest lat]
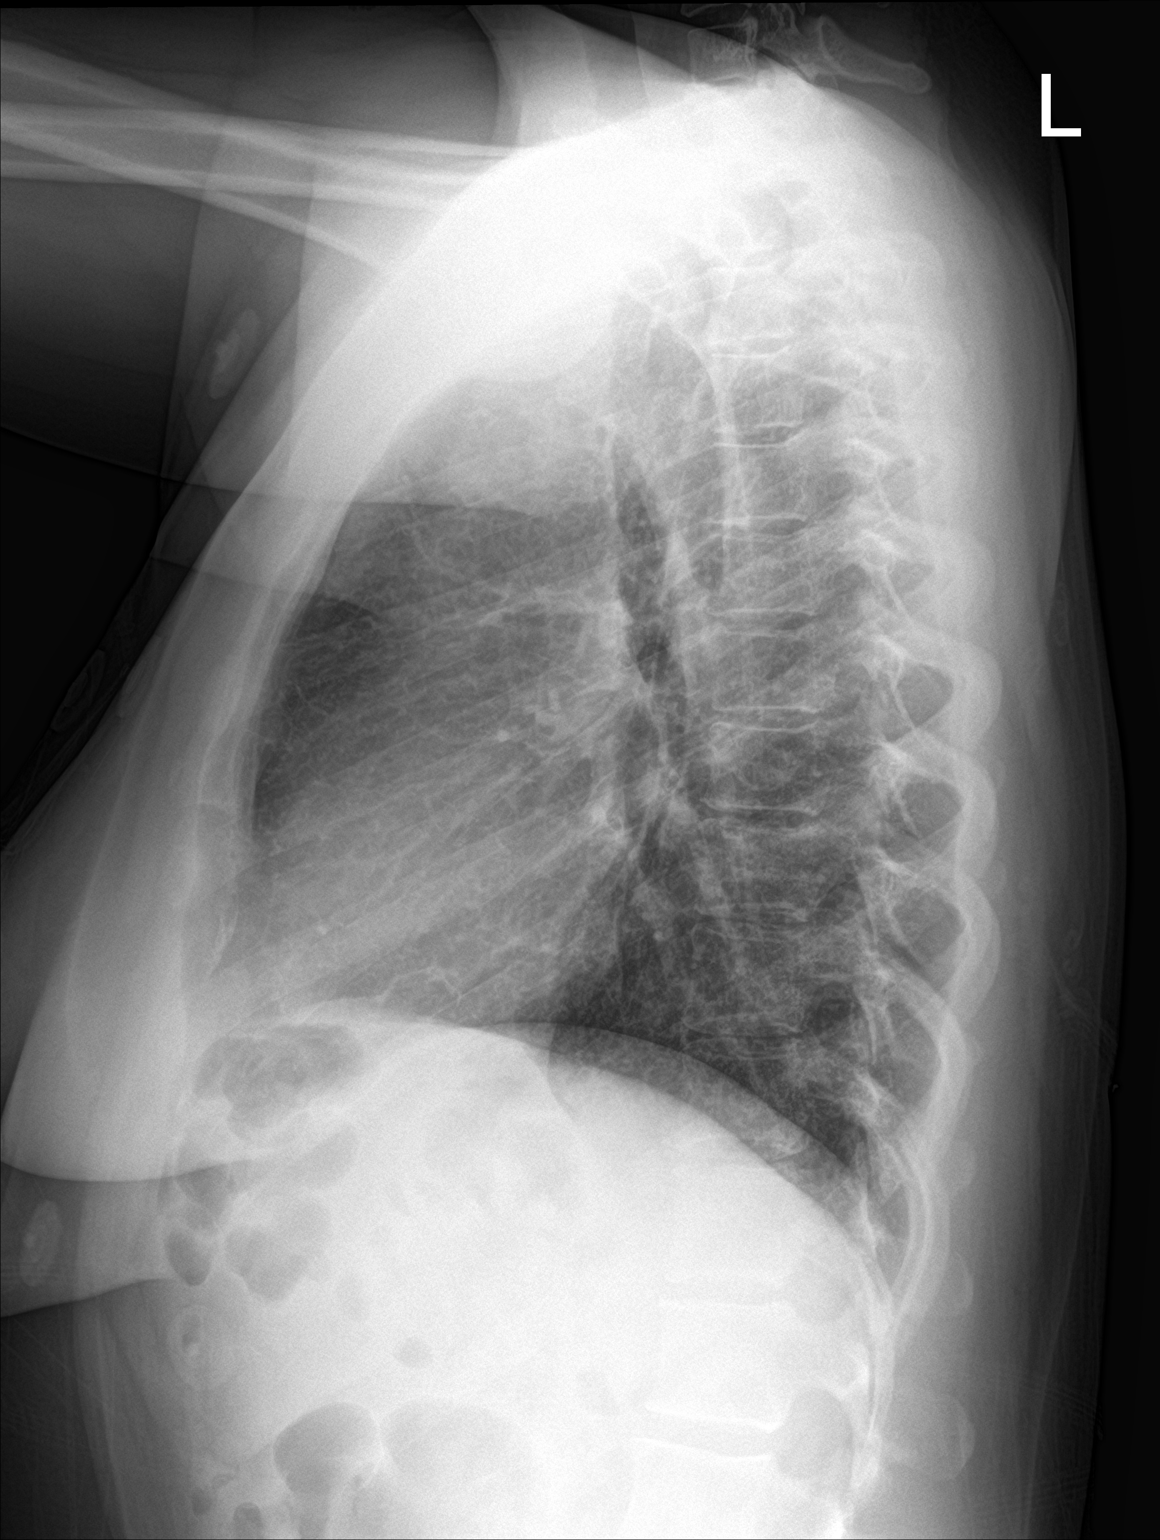

[2 of 2 positions shown; findings below may reference images not displayed]

FINDINGS: The heart size and mediastinal contours are within normal limits.
Both lungs are clear. The visualized skeletal structures are
unremarkable.
IMPRESSION: No active cardiopulmonary disease.

## 2021-08-12 ENCOUNTER — Other Ambulatory Visit: Payer: Self-pay

## 2021-08-12 ENCOUNTER — Encounter: Payer: Self-pay | Admitting: Emergency Medicine

## 2021-08-12 ENCOUNTER — Ambulatory Visit
Admission: EM | Admit: 2021-08-12 | Discharge: 2021-08-12 | Disposition: A | Discharge status: Home / Self Care | Payer: Medicaid Other | Attending: Urgent Care | Admitting: Urgent Care

## 2021-08-12 DIAGNOSIS — B349 Viral infection, unspecified: Secondary | ICD-10-CM

## 2021-08-12 DIAGNOSIS — Z9189 Other specified personal risk factors, not elsewhere classified: Secondary | ICD-10-CM | POA: Diagnosis not present

## 2021-08-12 DIAGNOSIS — R052 Subacute cough: Secondary | ICD-10-CM

## 2021-08-12 DIAGNOSIS — R52 Pain, unspecified: Secondary | ICD-10-CM | POA: Diagnosis not present

## 2021-08-12 DIAGNOSIS — R0789 Other chest pain: Secondary | ICD-10-CM

## 2021-08-12 MED ORDER — OSELTAMIVIR PHOSPHATE 75 MG PO CAPS: 75.0000 mg | ORAL_CAPSULE | Freq: Two times a day (BID) | ORAL | 0 refills | Status: DC

## 2021-08-12 MED ORDER — IPRATROPIUM BROMIDE 0.03 % NA SOLN: 2.0000 | Freq: Two times a day (BID) | NASAL | 0 refills | Status: DC

## 2021-08-12 MED ORDER — PROMETHAZINE-DM 6.25-15 MG/5ML PO SYRP: 5.0000 mL | ORAL_SOLUTION | Freq: Every evening | ORAL | 0 refills | Status: DC | PRN

## 2021-08-12 MED ORDER — CETIRIZINE HCL 10 MG PO TABS: 10.0000 mg | ORAL_TABLET | Freq: Every day | ORAL | 0 refills | Status: DC

## 2021-08-12 MED ORDER — PSEUDOEPHEDRINE HCL 30 MG PO TABS: 30.0000 mg | ORAL_TABLET | Freq: Three times a day (TID) | ORAL | 0 refills | Status: DC | PRN

## 2021-08-12 MED ORDER — BENZONATATE 100 MG PO CAPS: 100.0000 mg | ORAL_CAPSULE | Freq: Three times a day (TID) | ORAL | 0 refills | Status: DC | PRN

## 2021-08-12 NOTE — ED Provider Notes (Signed)
[place]   MRN: [id number] DOB: 10/10/1975  Subjective:   ACHANTE STURDY is a 46 y.o. female presenting for 1 day history of acute onset chills, throat pain, coughing, headaches, back pains, head congestion, body aches. Works at [employer]. Has a history of seizures, last episode was years ago. Has had chest pain, shob, wheezing. No smoking. Has exposure to second hand smoke.   No current facility-administered medications for this encounter.  Current Outpatient Medications:    acetaminophen (TYLENOL) 500 MG tablet, Take 1,000 mg by mouth as needed. For pain, Disp: , Rfl:    ALPRAZolam (XANAX) 1 MG tablet, Take 1-2 tablets thirty minutes prior to MRI.  May repeat with 1 additional tablet prior to entering scanner, if needed.  Must have driver., Disp: 3 tablet, Rfl: 0   aspirin EC 81 MG tablet, Take 81 mg by mouth daily., Disp: , Rfl:    hydrOXYzine (ATARAX/VISTARIL) 25 MG tablet, Take 1 tablet (25 mg total) by mouth every 6 (six) hours., Disp: 12 tablet, Rfl: 0   ibuprofen (ADVIL,MOTRIN) 200 MG tablet, Take 200-400 mg by mouth every 6 (six) hours as needed for moderate pain or cramping., Disp: , Rfl:    levETIRAcetam (KEPPRA) 500 MG tablet, Take 1 tablet (500 mg total) by mouth 2 (two) times daily., Disp: 60 tablet, Rfl: 1   omeprazole (PRILOSEC OTC) 20 MG tablet, Take 20 mg by mouth daily., Disp: , Rfl:    Oxcarbazepine (TRILEPTAL) 300 MG tablet, Take 1 tablet (300 mg total) by mouth 2 (two) times daily. Must call [phone] to schedule an appt to continue refills., Disp: 60 tablet, Rfl: 1   Allergies  Allergen Reactions   Lamictal [Lamotrigine] Rash    Past Medical History:  Diagnosis Date   Acid reflux    Anxiety and depression    Full dentures    Headache    Seizure ([place])      Past Surgical History:  Procedure Laterality Date   TUBAL LIGATION     WISDOM TOOTH EXTRACTION      Family History  Problem Relation Age of Onset   Hypertension Mother    Other  Father        unsure of medical history   Stroke Maternal Grandfather    Colon cancer Maternal Aunt     Social History   Tobacco Use   Smoking status: Former    Types: Cigarettes   Smokeless tobacco: Never  Substance Use Topics   Alcohol use: Yes    Alcohol/week: 6.0 standard drinks    Types: 6 Cans of beer per week    Comment: occsionally   Drug use: No    ROS   Objective:   Vitals: BP 122/81 (BP Location: Right Arm)    Pulse 92    Temp 98.4 F (36.9 C) (Oral)    Resp 18    LMP 08/05/2021 (Approximate)    SpO2 97%   Physical Exam Constitutional:      General: She is not in acute distress.    Appearance: Normal appearance. She is well-developed and normal weight. She is not ill-appearing, toxic-appearing or diaphoretic.  HENT:     Head: Normocephalic and atraumatic.     Right Ear: Tympanic membrane, ear canal and external ear normal. No drainage or tenderness. No middle ear effusion. There is no impacted cerumen. Tympanic membrane is not erythematous.     Left Ear: Tympanic membrane, ear canal and external ear normal. No drainage or tenderness.  No  middle ear effusion. There is no impacted cerumen. Tympanic membrane is not erythematous.     Nose: Nose normal. No congestion or rhinorrhea.     Mouth/Throat:     Mouth: Mucous membranes are moist. No oral lesions.     Pharynx: No pharyngeal swelling, oropharyngeal exudate, posterior oropharyngeal erythema or uvula swelling.     Tonsils: No tonsillar exudate or tonsillar abscesses.  Eyes:     General: No scleral icterus.       Right eye: No discharge.        Left eye: No discharge.     Extraocular Movements: Extraocular movements intact.     Right eye: Normal extraocular motion.     Left eye: Normal extraocular motion.     Conjunctiva/sclera: Conjunctivae normal.  Cardiovascular:     Rate and Rhythm: Normal rate and regular rhythm.     Pulses: Normal pulses.     Heart sounds: Normal heart sounds. No murmur heard.   No  friction rub. No gallop.  Pulmonary:     Effort: Pulmonary effort is normal. No respiratory distress.     Breath sounds: Normal breath sounds. No stridor. No wheezing, rhonchi or rales.  Musculoskeletal:     Cervical back: Normal range of motion and neck supple.  Lymphadenopathy:     Cervical: No cervical adenopathy.  Skin:    General: Skin is warm and dry.     Findings: No rash.  Neurological:     General: No focal deficit present.     Mental Status: She is alert and oriented to person, place, and time.     Comments: Negative Kernig and Brudzinski.  Psychiatric:        Mood and Affect: Mood normal.        Behavior: Behavior normal.        Thought Content: Thought content normal.        Judgment: Judgment normal.    Assessment and Plan :   PDMP not reviewed this encounter.  1. Acute viral syndrome   2. At increased risk of exposure to COVID-19 virus   3. Body aches   4. Atypical chest pain   5. Subacute cough    Deferred imaging given clear cardiopulmonary exam, hemodynamically stable vital signs. COVID and flu testing pending.  Will cover for influenza with Tamiflu given exposure, symptom set, current incidence in the community.  Use supportive care, rest, fluids, hydration, light meals, schedule Tylenol and ibuprofen. Counseled patient on potential for adverse effects with medications prescribed today, patient verbalized understanding. ER and return-to-clinic precautions discussed, patient verbalized understanding.    Jaynee Eagles, [place] 08/12/21 1909

## 2021-08-12 NOTE — Discharge Instructions (Addendum)
We will notify you of your test results as they arrive and may take between 48-72 hours.  I encourage you to sign up for MyChart if you have not already done so as this can be the easiest way for us to communicate results to you online or through a phone app.  Generally, we only contact you if it is a positive test result.  In the meantime, if you develop worsening symptoms including fever, chest pain, shortness of breath despite our current treatment plan then please report to the emergency room as this may be a sign of worsening status from possible viral infection.  Otherwise, we will manage this as a viral syndrome like influenza with Tamiflu. For sore throat or cough try using a honey-based tea. Use 3 teaspoons of honey with juice squeezed from half lemon. Place shaved pieces of ginger into 1/2-1 cup of water and warm over stove top. Then mix the ingredients and repeat every 4 hours as needed. Please take Tylenol 500mg-650mg every 6 hours for aches and pains, fevers. Hydrate very well with at least 2 liters of water. Eat light meals such as soups to replenish electrolytes and soft fruits, veggies. Start an antihistamine like Zyrtec for postnasal drainage, sinus congestion.  You can take this together with pseudoephedrine (Sudafed) at a dose of 30 mg 2-3 times a day as needed for the same kind of congestion.    

## 2021-08-12 NOTE — ED Triage Notes (Signed)
Chills, sore throat, headache and back pain, cough and head congestion since yesterday.

## 2021-08-13 LAB — COVID-19, FLU A+B NAA
Influenza A, NAA: NOT DETECTED
Influenza B, NAA: NOT DETECTED
SARS-CoV-2, NAA: DETECTED — AB

## 2022-04-12 ENCOUNTER — Encounter: Payer: Self-pay | Admitting: Emergency Medicine

## 2022-04-12 ENCOUNTER — Ambulatory Visit
Admission: EM | Admit: 2022-04-12 | Discharge: 2022-04-12 | Disposition: A | Discharge status: Home / Self Care | Payer: Medicaid Other | Attending: Nurse Practitioner | Admitting: Nurse Practitioner

## 2022-04-12 DIAGNOSIS — N939 Abnormal uterine and vaginal bleeding, unspecified: Secondary | ICD-10-CM | POA: Insufficient documentation

## 2022-04-12 DIAGNOSIS — J069 Acute upper respiratory infection, unspecified: Secondary | ICD-10-CM | POA: Insufficient documentation

## 2022-04-12 DIAGNOSIS — Z1152 Encounter for screening for COVID-19: Secondary | ICD-10-CM | POA: Insufficient documentation

## 2022-04-12 LAB — POCT URINE PREGNANCY: Preg Test, Ur: NEGATIVE

## 2022-04-12 MED ORDER — FLUTICASONE PROPIONATE 50 MCG/ACT NA SUSP: 2.0000 | Freq: Every day | NASAL | 0 refills | Status: DC

## 2022-04-12 MED ORDER — MEDROXYPROGESTERONE ACETATE 5 MG PO TABS: 10.0000 mg | ORAL_TABLET | Freq: Every day | ORAL | 0 refills | Status: DC

## 2022-04-12 NOTE — Discharge Instructions (Addendum)
Take medication as prescribed.  Purchase over-the-counter Coricidin HBP to  help with cough Increase fluids and allow for plenty of rest. May use normal saline nasal spray throughout the day to help with nasal congestion. COVID test is pending.  If your results are positive, you will be contacted to discuss treatment.  You will be a candidate to receive molnupiravir for an antiviral. May take over-the-counter Tylenol as needed for pain or discomfort. Follow-up with your primary care physician if symptoms fail to improve.  Take medication as prescribed. If your bleeding worsens, as discussed, it is recommended that you follow-up with gynecology as soon as possible. Increase fluids and allow for plenty of rest. As discussed, you declined further work-up as you have had this already with your primary care physician. Go to the emergency department if you have worsening bleeding, shortness of breath, difficulty breathing, or other concerns. Follow-up as needed.

## 2022-04-12 NOTE — ED Provider Notes (Signed)
[facility]    CSN: [id number] Arrival date & time: 04/12/22  1301      History   Chief Complaint No chief complaint on file.   HPI [name] is a 46 y.o. female.   The history is provided by the patient.   Patient presents for complaints of cough, shortness of breath, nasal congestion that started this morning.  She denies fever, chills, sore throat, headache, wheezing, difficulty breathing, nausea, vomiting, or diarrhea.  Patient denies any known sick contacts.  Patient would like to have COVID testing today.  Patient also complains of abnormal menstrual bleeding that is been present for the past month.  Patient states she has been seen by the health department on several occasions for the same or similar symptoms.  States that she was tried on OCP but she cannot tolerate the side effects of the medication and stopped it.  She states that she is feeling adult diapers and feeling them at least every 1 to 1.5 hours.  She states that her mother went through the same or similar symptoms at the same age.  Patient denies history of uterine fibroids or cyst.  Past Medical History:  Diagnosis Date   Acid reflux    Anxiety and depression    Full dentures    Headache    Seizure [facility])     Patient Active Problem List   Diagnosis Date Noted   Partial symptomatic epilepsy with complex partial seizures, not intractable, without status epilepticus (HCC) 07/19/2018   Mood disorder (HCC) 07/19/2018    Past Surgical History:  Procedure Laterality Date   TUBAL LIGATION     WISDOM TOOTH EXTRACTION      OB History   No obstetric history on file.      Home Medications    Prior to Admission medications   Medication Sig Start Date End Date Taking? Authorizing Provider  fluticasone (FLONASE) 50 MCG/ACT nasal spray Place 2 sprays into both nostrils daily. 04/12/22  Yes Malinda Mayden-Warren, Sadie Haber, NP  medroxyPROGESTERone (PROVERA) 5 MG tablet Take 2 tablets (10 mg total) by  mouth daily for 10 days. 04/12/22 04/22/22 Yes Jakeya Gherardi-Warren, Sadie Haber, NP  acetaminophen (TYLENOL) 500 MG tablet Take 1,000 mg by mouth as needed. For pain    [provider]  ALPRAZolam Prudy Feeler) 1 MG tablet Take 1-2 tablets thirty minutes prior to MRI.  May repeat with 1 additional tablet prior to entering scanner, if needed.  Must have driver. 08/25/18   Levert Feinstein, MD  aspirin EC 81 MG tablet Take 81 mg by mouth daily.    [provider]  benzonatate (TESSALON) 100 MG capsule Take 1-2 capsules (100-200 mg total) by mouth 3 (three) times daily as needed for cough. 08/12/21   Wallis Bamberg, PA-C  cetirizine (ZYRTEC ALLERGY) 10 MG tablet Take 1 tablet (10 mg total) by mouth daily. 08/12/21   Wallis Bamberg, PA-C  hydrOXYzine (ATARAX/VISTARIL) 25 MG tablet Take 1 tablet (25 mg total) by mouth every 6 (six) hours. 08/11/18   Geoffery Lyons, MD  ibuprofen (ADVIL,MOTRIN) 200 MG tablet Take 200-400 mg by mouth every 6 (six) hours as needed for moderate pain or cramping.    [provider]  ipratropium (ATROVENT) 0.03 % nasal spray Place 2 sprays into both nostrils 2 (two) times daily. 08/12/21   Wallis Bamberg, PA-C  levETIRAcetam (KEPPRA) 500 MG tablet Take 1 tablet (500 mg total) by mouth 2 (two) times daily. 08/09/18   York Spaniel, MD  omeprazole (PRILOSEC OTC) 20 MG tablet Take 20 mg by mouth daily.    [provider]  oseltamivir (TAMIFLU) 75 MG capsule Take 1 capsule (75 mg total) by mouth 2 (two) times daily. 08/12/21   Wallis Bamberg, PA-C  Oxcarbazepine (TRILEPTAL) 300 MG tablet Take 1 tablet (300 mg total) by mouth 2 (two) times daily. Must call [phone] to schedule an appt to continue refills. 09/09/19   Levert Feinstein, MD  promethazine-dextromethorphan (PROMETHAZINE-DM) 6.25-15 MG/5ML syrup Take 5 mLs by mouth at bedtime as needed for cough. 08/12/21   Wallis Bamberg, PA-C  pseudoephedrine (SUDAFED) 30 MG tablet Take 1 tablet (30 mg total) by mouth every 8 (eight) hours as needed for  congestion. 08/12/21   Wallis Bamberg, PA-C    Family History Family History  Problem Relation Age of Onset   Hypertension Mother    Other Father        unsure of medical history   Stroke Maternal Grandfather    Colon cancer Maternal Aunt     Social History Social History   Tobacco Use   Smoking status: Former    Types: Cigarettes   Smokeless tobacco: Never  Substance Use Topics   Alcohol use: Yes    Alcohol/week: 6.0 standard drinks of alcohol    Types: 6 Cans of beer per week    Comment: occsionally   Drug use: No     Allergies   Lamictal [lamotrigine]   Review of Systems Review of Systems Per HPI  Physical Exam Triage Vital Signs ED Triage Vitals  Enc Vitals Group     BP 04/12/22 1412 123/84     Pulse Rate 04/12/22 1412 87     Resp 04/12/22 1412 16     Temp 04/12/22 1412 98.9 F (37.2 C)     Temp Source 04/12/22 1412 Oral     SpO2 04/12/22 1412 99 %     Weight --      Height --      Head Circumference --      Peak Flow --      Pain Score 04/12/22 1413 3     Pain Loc --      Pain Edu? --      Excl. in GC? --    No data found.  Updated Vital Signs BP 123/84 (BP Location: Right Arm)   Pulse 87   Temp 98.9 F (37.2 C) (Oral)   Resp 16   LMP 03/12/2022 (Approximate)   SpO2 99%   Visual Acuity Right Eye Distance:   Left Eye Distance:   Bilateral Distance:    Right Eye Near:   Left Eye Near:    Bilateral Near:     Physical Exam Vitals and nursing note reviewed.  Constitutional:      General: She is not in acute distress.    Appearance: She is well-developed.  HENT:     Head: Normocephalic.     Right Ear: Hearing, tympanic membrane, ear canal and external ear normal.     Left Ear: Hearing, tympanic membrane, ear canal and external ear normal.     Nose:     Right Turbinates: Enlarged and swollen.     Left Turbinates: Enlarged and swollen.     Right Sinus: No maxillary sinus tenderness or frontal sinus tenderness.     Left Sinus: No  maxillary sinus tenderness or frontal sinus tenderness.     Mouth/Throat:     Lips: Pink.     Mouth: Mucous membranes  are moist.     Pharynx: Oropharynx is clear. Posterior oropharyngeal erythema present. No oropharyngeal exudate or uvula swelling.     Tonsils: 0 on the right. 0 on the left.  Eyes:     Extraocular Movements: Extraocular movements intact.     Conjunctiva/sclera: Conjunctivae normal.     Pupils: Pupils are equal, round, and reactive to light.  Cardiovascular:     Rate and Rhythm: Normal rate and regular rhythm.     Heart sounds: Normal heart sounds.  Pulmonary:     Effort: Pulmonary effort is normal.     Breath sounds: Normal breath sounds.  Abdominal:     General: Bowel sounds are normal. There is no distension.     Palpations: Abdomen is soft.     Tenderness: There is generalized abdominal tenderness. There is no right CVA tenderness, left CVA tenderness, guarding or rebound.  Genitourinary:    Vagina: Normal. No vaginal discharge.  Skin:    General: Skin is warm and dry.     Findings: No erythema or rash.  Neurological:     Mental Status: She is alert and oriented to person, place, and time.     Cranial Nerves: No cranial nerve deficit.  Psychiatric:        Mood and Affect: Mood normal.        Behavior: Behavior normal.      UC Treatments / Results  Labs (all labs ordered are listed, but only abnormal results are displayed) Labs Reviewed  SARS CORONAVIRUS 2 (TAT 6-24 HRS)  POCT URINE PREGNANCY    EKG   Radiology No results found.  Procedures Procedures (including critical care time)  Medications Ordered in UC Medications - No data to display  Initial Impression / Assessment and Plan / UC Course  I have reviewed the triage vital signs and the nursing notes.  Pertinent labs & imaging results that were available during my care of the patient were reviewed by me and considered in my medical decision making (see chart for details).  Patient  presents for upper respiratory symptoms and abnormal uterine bleeding that has been going on for the past month.  On exam, patient's vital signs are stable, she is in no acute distress.  Upper respiratory symptoms appear to be viral in nature at this time as they have only been present for 1 day.  Will provide symptomatic treatment for the patient to include fluticasone.  Recommended the use of over-the-counter Coricidin HBP cough medicine to help with her cough.  Symptoms are consistent with viral upper respiratory infection with cough.  With regard to the abnormal uterine bleeding, patient was advised to follow-up with gynecology if she feels the current physician is not helping her symptoms.  Patient declines further work-up or additional work-up today as she currently has no insurance and does not want to incur any additional cost in addition to work-up previously completed by her PCP.  Medroxyprogesterone was prescribed to see if this helps slow the patient's abnormal uterine bleeding.  Supportive care recommendations were provided to the patient.  Patient advised to go to the emergency department her vaginal symptoms worsen or fail to improve.  Patient advised to follow-up with her primary care physician as needed. Final Clinical Impressions(s) / UC Diagnoses   Final diagnoses:  Viral upper respiratory tract infection with cough  Encounter for screening for COVID-19  Abnormal uterine bleeding     Discharge Instructions      Take medication as prescribed.  Purchase  over-the-counter Coricidin HBP to  help with cough Increase fluids and allow for plenty of rest. May use normal saline nasal spray throughout the day to help with nasal congestion. COVID test is pending.  If your results are positive, you will be contacted to discuss treatment.  You will be a candidate to receive molnupiravir for an antiviral. May take over-the-counter Tylenol as needed for pain or discomfort. Follow-up with your  primary care physician if symptoms fail to improve.  Take medication as prescribed. If your bleeding worsens, as discussed, it is recommended that you follow-up with gynecology as soon as possible. Increase fluids and allow for plenty of rest. As discussed, you declined further work-up as you have had this already with your primary care physician. Go to the emergency department if you have worsening bleeding, shortness of breath, difficulty breathing, or other concerns. Follow-up as needed.     ED Prescriptions     Medication Sig Dispense Auth. Provider   medroxyPROGESTERone (PROVERA) 5 MG tablet Take 2 tablets (10 mg total) by mouth daily for 10 days. 20 tablet Javionna Leder-Warren, Sadie Haber, NP   fluticasone (FLONASE) 50 MCG/ACT nasal spray Place 2 sprays into both nostrils daily. 16 g Mance Vallejo-Warren, Sadie Haber, NP      PDMP not reviewed this encounter.   Abran Cantor, NP 04/12/22 1520

## 2022-04-12 NOTE — ED Triage Notes (Signed)
States she has been on her menstrual cycle x 1 month.  Has been seen at the health department for same.  States she has been using adult diapers and have been filling them up over an 1 hour time.  States she feels SOB this morning with productive cough and nasal congestion since yesterday.  States upper part of ABD hurts today.

## 2022-04-13 LAB — SARS CORONAVIRUS 2 (TAT 6-24 HRS): SARS Coronavirus 2: NEGATIVE

## 2022-12-15 DIAGNOSIS — Z1231 Encounter for screening mammogram for malignant neoplasm of breast: Secondary | ICD-10-CM | POA: Diagnosis not present

## 2022-12-22 DIAGNOSIS — F322 Major depressive disorder, single episode, severe without psychotic features: Secondary | ICD-10-CM | POA: Diagnosis not present

## 2022-12-22 DIAGNOSIS — F411 Generalized anxiety disorder: Secondary | ICD-10-CM | POA: Diagnosis not present

## 2022-12-30 DIAGNOSIS — N6312 Unspecified lump in the right breast, upper inner quadrant: Secondary | ICD-10-CM | POA: Diagnosis not present

## 2023-01-08 ENCOUNTER — Other Ambulatory Visit (HOSPITAL_COMMUNITY)
Admission: RE | Admit: 2023-01-08 | Discharge: 2023-01-08 | Disposition: A | Discharge status: Home / Self Care | Payer: Medicaid Other | Source: Ambulatory Visit | Attending: Obstetrics & Gynecology | Admitting: Obstetrics & Gynecology

## 2023-01-08 ENCOUNTER — Ambulatory Visit: Payer: Medicaid Other | Admitting: Obstetrics & Gynecology

## 2023-01-08 ENCOUNTER — Encounter: Payer: Self-pay | Admitting: Obstetrics & Gynecology

## 2023-01-08 VITALS — BP 151/86 | HR 40 | Ht 63.0 in | Wt 189.4 lb

## 2023-01-08 DIAGNOSIS — I10 Essential (primary) hypertension: Secondary | ICD-10-CM | POA: Diagnosis not present

## 2023-01-08 DIAGNOSIS — Z124 Encounter for screening for malignant neoplasm of cervix: Secondary | ICD-10-CM

## 2023-01-08 DIAGNOSIS — D500 Iron deficiency anemia secondary to blood loss (chronic): Secondary | ICD-10-CM | POA: Diagnosis not present

## 2023-01-08 DIAGNOSIS — N920 Excessive and frequent menstruation with regular cycle: Secondary | ICD-10-CM

## 2023-01-08 MED ORDER — NORETHINDRONE 0.35 MG PO TABS: 1.0000 | ORAL_TABLET | Freq: Every day | ORAL | 4 refills | Status: DC

## 2023-01-08 NOTE — Progress Notes (Signed)
GYN VISIT Patient name: [name] MRN [id number]  Date of birth: 03-Mar-1976 Chief Complaint:   Pelvic Pain (Bilateral pain in ovaries)  History of Present Illness:   [name] is a 47 y.o. [phone] female being seen today for the following concerns:  -Pelvic pain: Notes intermittent pain for the past 2-3 yrs.  Mostly pain is during her menses, but also randomly.  Typically pain bilateral sides as an achiness, radiates to the middle.  Rates her pain, specifically dysmenorrhea 8-9/10.  Takes tylenol and doesn't seem to help.  HMB: Menses are every month- last for about 4 days (previously 7 days).  Notes considerable heaviness- changing adult diaper every hour.  Notes fatigue, dizziness and headache during her period.  Bleeding has led to anemia- currently on iron and vitamin D.  Contraception: BTL NSVD x 4  No LMP recorded.    Review of Systems:   Pertinent items are noted in HPI Denies fever/chills, dizziness, headaches, visual disturbances, fatigue, shortness of breath, chest pain, abdominal pain, vomiting,no problems with bowel movements, urination, or intercourse unless otherwise stated above.  Pertinent History Reviewed:   Past Surgical History:  Procedure Laterality Date   TUBAL LIGATION     WISDOM TOOTH EXTRACTION      Past Medical History:  Diagnosis Date   Acid reflux    Anxiety and depression    Full dentures    Headache    Seizure (HCC)   Chronic HTN  Reviewed problem list, medications and allergies. Physical Assessment:   Vitals:   01/08/23 0941 01/08/23 0951  BP: (!) 154/81 (!) 151/86  Pulse: (!) 54 (!) 40  Weight: 189 lb 6.4 oz (85.9 kg)   Height: 5\' 3"  (1.6 m)   Body mass index is 33.55 kg/m.       Physical Examination:   General appearance: alert, well appearing, and in no distress  Psych: mood appropriate, normal affect  Skin: warm & dry   Cardiovascular: normal heart rate noted  Respiratory: normal respiratory effort, no  distress  Abdomen: soft, non-tender-no reproducible pain, no rebound no guarding  Pelvic: VULVA: normal appearing vulva with no masses, tenderness or lesions, VAGINA: normal appearing vagina with normal color and discharge, no lesions, CERVIX: normal appearing cervix without discharge or lesions, UTERUS: uterus is normal size, shape, consistency and nontender, ADNEXA: normal adnexa in size, nontender and no masses  Extremities: no edema   Chaperone: Faith Rogue    Assessment & Plan:  1) HMB, pelvic pain -Discussed potential causes including perimenopausal status -Recommend pelvic ultrasound to rule out underlying etiology -Discussed all management options ranging from medication to LARCs to surgical intervention such as endometrial ablation or hysterectomy -On initial review of options she is concerned about recovery time from surgical management  []  Plan for pelvic ultrasound at next available and trial of POPs []  f/u in 3mos if no improvement will plan for EMB   2) cervical cancer screening -Pap collected today reviewed ASCCP guidelines  3) Chronic HTN -Patient has follow-up with PCP next week Meds ordered this encounter  Medications   norethindrone (MICRONOR) 0.35 MG tablet    Sig: Take 1 tablet (0.35 mg total) by mouth daily.    Dispense:  90 tablet    Refill:  4     Orders Placed This Encounter  Procedures   US PELVIC COMPLETE WITH TRANSVAGINAL    Return for pelvic US at [facility] and medicaiton follow up in 3mos with Biannca Scantlin.   Myna Hidalgo,  DO Attending Obstetrician & Gynecologist, [facility] [employer], [facility]

## 2023-01-13 LAB — CYTOLOGY - PAP
Comment: NEGATIVE
Diagnosis: NEGATIVE
High risk HPV: NEGATIVE

## 2023-01-15 DIAGNOSIS — F322 Major depressive disorder, single episode, severe without psychotic features: Secondary | ICD-10-CM | POA: Diagnosis not present

## 2023-01-15 DIAGNOSIS — F411 Generalized anxiety disorder: Secondary | ICD-10-CM | POA: Diagnosis not present

## 2023-01-21 ENCOUNTER — Ambulatory Visit (HOSPITAL_COMMUNITY)
Admission: RE | Admit: 2023-01-21 | Discharge: 2023-01-21 | Disposition: A | Discharge status: Home / Self Care | Payer: Medicaid Other | Source: Ambulatory Visit | Attending: Obstetrics & Gynecology | Admitting: Obstetrics & Gynecology

## 2023-01-21 DIAGNOSIS — N920 Excessive and frequent menstruation with regular cycle: Secondary | ICD-10-CM | POA: Insufficient documentation

## 2023-01-22 DIAGNOSIS — F419 Anxiety disorder, unspecified: Secondary | ICD-10-CM | POA: Diagnosis not present

## 2023-01-22 DIAGNOSIS — G4700 Insomnia, unspecified: Secondary | ICD-10-CM | POA: Diagnosis not present

## 2023-03-11 ENCOUNTER — Encounter: Payer: Self-pay | Admitting: Internal Medicine

## 2023-03-11 ENCOUNTER — Ambulatory Visit: Payer: Medicaid Other | Admitting: Internal Medicine

## 2023-05-06 DIAGNOSIS — E669 Obesity, unspecified: Secondary | ICD-10-CM | POA: Diagnosis not present

## 2023-05-06 DIAGNOSIS — E663 Overweight: Secondary | ICD-10-CM | POA: Diagnosis not present

## 2023-05-06 DIAGNOSIS — R0981 Nasal congestion: Secondary | ICD-10-CM | POA: Diagnosis not present

## 2023-05-25 ENCOUNTER — Ambulatory Visit: Payer: Medicaid Other | Admitting: Internal Medicine

## 2023-05-28 DIAGNOSIS — F419 Anxiety disorder, unspecified: Secondary | ICD-10-CM | POA: Diagnosis not present

## 2023-05-28 DIAGNOSIS — G4700 Insomnia, unspecified: Secondary | ICD-10-CM | POA: Diagnosis not present

## 2023-06-04 ENCOUNTER — Telehealth: Payer: Self-pay | Admitting: Internal Medicine

## 2023-06-04 ENCOUNTER — Encounter: Payer: Self-pay | Admitting: Internal Medicine

## 2023-06-04 ENCOUNTER — Ambulatory Visit: Payer: Medicaid Other | Attending: Internal Medicine | Admitting: Internal Medicine

## 2023-06-04 VITALS — BP 162/98 | HR 58 | Ht 63.0 in | Wt 179.2 lb

## 2023-06-04 DIAGNOSIS — I10 Essential (primary) hypertension: Secondary | ICD-10-CM

## 2023-06-04 DIAGNOSIS — R030 Elevated blood-pressure reading, without diagnosis of hypertension: Secondary | ICD-10-CM | POA: Diagnosis not present

## 2023-06-04 NOTE — Telephone Encounter (Signed)
  Patient Consent for Virtual Visit        [name] has provided verbal consent on 06/04/2023 for a virtual visit (video or telephone).   CONSENT FOR VIRTUAL VISIT FOR:  [name]  By participating in this virtual visit I agree to the following:  I hereby voluntarily request, consent and authorize [facility] and its employed or contracted physicians, physician assistants, nurse practitioners or other licensed health care professionals (the Practitioner), to provide me with telemedicine health care services (the "Services") as deemed necessary by the treating Practitioner. I acknowledge and consent to receive the Services by the Practitioner via telemedicine. I understand that the telemedicine visit Lopez involve communicating with the Practitioner through live audiovisual communication technology and the disclosure of certain medical information by electronic transmission. I acknowledge that I have been given the opportunity to request an in-person assessment or other available alternative prior to the telemedicine visit and am voluntarily participating in the telemedicine visit.  I understand that I have the right to withhold or withdraw my consent to the use of telemedicine in the course of my care at any time, without affecting my right to future care or treatment, and that the Practitioner or I may terminate the telemedicine visit at any time. I understand that I have the right to inspect all information obtained and/or recorded in the course of the telemedicine visit and may receive copies of available information for a reasonable fee.  I understand that some of the potential risks of receiving the Services via telemedicine include:  Delay or interruption in medical evaluation due to technological equipment failure or disruption; Information transmitted may not be sufficient (e.g. poor resolution of images) to allow for appropriate medical decision making by the  Practitioner; and/or  In rare instances, security protocols could fail, causing a breach of personal health information.  Furthermore, I acknowledge that it is my responsibility to provide information about my medical history, conditions and care that is complete and accurate to the best of my ability. I acknowledge that Practitioner's advice, recommendations, and/or decision may be based on factors not within their control, such as incomplete or inaccurate data provided by me or distortions of diagnostic images or specimens that may result from electronic transmissions. I understand that the practice of medicine is not an exact science and that Practitioner makes no warranties or guarantees regarding treatment outcomes. I acknowledge that a copy of this consent can be made available to me via my patient portal [facility] MyChart), or I can request a printed copy by calling the office of [facility].    I understand that my insurance Lopez be billed for this visit.   I have read or had this consent read to me. I understand the contents of this consent, which adequately explains the benefits and risks of the Services being provided via telemedicine.  I have been provided ample opportunity to ask questions regarding this consent and the Services and have had my questions answered to my satisfaction. I give my informed consent for the services to be provided through the use of telemedicine in my medical care

## 2023-06-04 NOTE — Progress Notes (Signed)
Cardiology Office Note  Date: 06/04/2023   ID: [name], DOB [birth year], MRN [id number]  PCP:  Elmer Picker, [facility]  Cardiologist:  Marjo Bicker, MD Electrophysiologist:  None   History of Present Illness: [name] is a 47 y.o. female known to have severe anxiety was referred to cardiology clinic for evaluation of HTN.  Patient's PCP started her on lisinopril, losartan and HCTZ and did not tolerate well.  She reported she felt funny and had to stop all the medications.  She does not check her blood pressures at home.  Whenever she comes to doctors offices, her blood pressure always stays elevated because of anxiety.  She is nervous throughout the interview.  She has a blood pressure machine at home but does not know where it is located.  She also has symptoms of chest pain, SOB, palpitations during the anxiety but not outside of this window.  No angina, DOE, orthopnea, PND, syncope.   Past Medical History:  Diagnosis Date   Acid reflux    Anxiety and depression    Full dentures    Headache    Seizure (HCC)     Past Surgical History:  Procedure Laterality Date   TUBAL LIGATION     WISDOM TOOTH EXTRACTION      Current Outpatient Medications  Medication Sig Dispense Refill   busPIRone (BUSPAR) 10 MG tablet Take 10 mg by mouth 2 (two) times daily.     Cholecalciferol 125 MCG (5000 UT) capsule Take 5,000 Units by mouth daily.     FLUoxetine (PROZAC) 40 MG capsule Take 40 mg by mouth daily.     hydrOXYzine (ATARAX) 50 MG tablet Take 50 mg by mouth 3 (three) times daily as needed for anxiety (sleep).     ibuprofen (ADVIL,MOTRIN) 200 MG tablet Take 200-400 mg by mouth every 6 (six) hours as needed for moderate pain or cramping.     Iron, Ferrous Sulfate, 325 (65 Fe) MG TABS 1 tablet Orally Three times a Week     omeprazole (PRILOSEC OTC) 20 MG tablet Take 20 mg by mouth daily.     Oxcarbazepine (TRILEPTAL) 300 MG tablet Take 1 tablet (300 mg  total) by mouth 2 (two) times daily. Must call [phone] to schedule an appt to continue refills. 60 tablet 1   No current facility-administered medications for this visit.   Allergies:  Losartan and Lamictal [lamotrigine]   Social History: The patient  reports that she has quit smoking. Her smoking use included cigarettes. She has never used smokeless tobacco. She reports current alcohol use of about 6.0 standard drinks of alcohol per week. She reports that she does not use drugs.   Family History: The patient's family history includes Colon cancer in her maternal aunt; Hypertension in her mother; Other in her father; Stroke in her maternal grandfather.   ROS:  Please see the history of present illness. Otherwise, complete review of systems is positive for none  All other systems are reviewed and negative.   Physical Exam: VS:  BP (!) 162/98 (BP Location: Right Arm, Cuff Size: Normal)   Pulse (!) 58   Ht 5\' 3"  (1.6 m)   Wt 179 lb 3.2 oz (81.3 kg)   SpO2 99%   BMI 31.74 kg/m , BMI Body mass index is 31.74 kg/m.  Wt Readings from Last 3 Encounters:  06/04/23 179 lb 3.2 oz (81.3 kg)  01/08/23 189 lb 6.4 oz (85.9 kg)  03/19/20 170 lb (77.1  kg)    General: Patient appears comfortable at rest. HEENT: Conjunctiva and lids normal, oropharynx clear with moist mucosa. Neck: Supple, no elevated JVP or carotid bruits, no thyromegaly. Lungs: Clear to auscultation, nonlabored breathing at rest. Cardiac: Regular rate and rhythm, no S3 or significant systolic murmur, no pericardial rub. Abdomen: Soft, nontender, no hepatomegaly, bowel sounds present, no guarding or rebound. Extremities: No pitting edema, distal pulses 2+. Skin: Warm and dry. Musculoskeletal: No kyphosis. Neuropsychiatric: Alert and oriented x3, affect grossly appropriate.  Recent Labwork: No results found for requested labs within last 365 days.  No results found for: "CHOL", "TRIG", "HDL", "CHOLHDL", "VLDL", "LDLCALC",  "LDLDIRECT"   Assessment and Plan:  Elevated BP reading in the office with no diagnosis of HTN  -Patient has severe anxiety, blood pressure is elevated in the doctor's office.  Does not check her blood pressures at home.  Unclear if she has true HTN.  She tried lisinopril, losartan, HCTZ in the past and was not able to tolerate it.  Will start with home blood pressure check and follow-up with her in 2 weeks through telephone visit.   I spent a total duration of 30 minutes during the prior notes, EKG, labs, face-to-face discussion/counseling of her medical condition, pathophysiology, evaluation, management, documenting the findings in the note.   Medication Adjustments/Labs and Tests Ordered: Current medicines are reviewed at length with the patient today.  Concerns regarding medicines are outlined above.    Disposition:  Follow up  2 weeks telephone visit  Signed Yachet Mattson Verne Spurr, MD, 06/04/2023 4:47 PM    [facility] [address] [place], [place], [place] [address]

## 2023-06-04 NOTE — Patient Instructions (Signed)
Medication Instructions:  Your physician recommends that you continue on your current medications as directed. Please refer to the Current Medication list given to you today.   Labwork: None  Testing/Procedures: None  Follow-Up: Your physician recommends that you schedule a follow-up appointment in: 2 week Telephone Visit  Any Other Special Instructions Will Be Listed Below (If Applicable). Please have a log of blood pressure readings for morning and evening for 2 week telephone visit.  Thank you for choosing [facility]!      If you need a refill on your cardiac medications before your next appointment, please call your pharmacy.

## 2023-06-08 DIAGNOSIS — G40909 Epilepsy, unspecified, not intractable, without status epilepticus: Secondary | ICD-10-CM | POA: Diagnosis not present

## 2023-06-08 DIAGNOSIS — B351 Tinea unguium: Secondary | ICD-10-CM | POA: Diagnosis not present

## 2023-06-08 DIAGNOSIS — I10 Essential (primary) hypertension: Secondary | ICD-10-CM | POA: Diagnosis not present

## 2023-06-23 ENCOUNTER — Ambulatory Visit: Payer: Medicaid Other | Admitting: Internal Medicine

## 2023-07-12 ENCOUNTER — Ambulatory Visit: Payer: 59 | Attending: Internal Medicine | Admitting: Internal Medicine

## 2023-07-12 NOTE — Progress Notes (Signed)
Erroneous encounter - please disregard.

## 2023-09-20 ENCOUNTER — Telehealth: Payer: Self-pay | Admitting: Neurology

## 2023-09-20 ENCOUNTER — Ambulatory Visit: Payer: 59 | Admitting: Neurology

## 2023-09-20 NOTE — Telephone Encounter (Signed)
 Pt rescheduled no show appt due to forgot to cancel appointment

## 2023-12-02 ENCOUNTER — Encounter: Payer: Self-pay | Admitting: Neurology

## 2023-12-02 ENCOUNTER — Ambulatory Visit: Payer: 59 | Admitting: Neurology

## 2023-12-06 NOTE — Telephone Encounter (Signed)
 Pt has no showed 2 New patient appointments 09/20/23 and 12/02/23. Ok to dismiss?

## 2023-12-06 NOTE — Telephone Encounter (Signed)
 Ok to dismiss. Thank you

## 2023-12-07 ENCOUNTER — Encounter: Payer: Self-pay | Admitting: Neurology

## 2024-02-24 ENCOUNTER — Ambulatory Visit: Admitting: Podiatry

## 2024-02-24 ENCOUNTER — Encounter: Payer: Self-pay | Admitting: Podiatry

## 2024-02-24 VITALS — Ht 63.0 in | Wt 180.0 lb

## 2024-02-24 DIAGNOSIS — L600 Ingrowing nail: Secondary | ICD-10-CM | POA: Diagnosis not present

## 2024-02-24 NOTE — Progress Notes (Signed)
 Subjective:   Patient ID: [name], female   DOB: 48 y.o.   MRN: [id number]   HPI Patient presents with severe thickness of the hallux nail bed right over left that is becoming increasingly painful and she has had a number of months of Lamisil without any change and has had topicals without any change and the nail is increasingly sore.  Patient does not smoke tries to be active   Review of Systems  All other systems reviewed and are negative.       Objective:  Physical Exam Vitals and nursing note reviewed.  Constitutional:      Appearance: She is well-developed.  Pulmonary:     Effort: Pulmonary effort is normal.  Musculoskeletal:        General: Normal range of motion.  Skin:    General: Skin is warm.  Neurological:     Mental Status: She is alert.     Neurovascular status intact muscle strength found to be adequate range of motion within normal limits with significant thickness of the hallux nail right over left that is dystrophic and painful when pressed from a dorsal direction.  Good digital perfusion well-oriented x 3     Assessment:  Damage nailbed right hallux thick and painful with pressure     Plan:  H&P reviewed and I discussed permanent nail removal given the fact she is already done oral medicine and topical without relief.  I do not see any other treatment except for trimming techniques and I do not like the fact she is having so much discomfort.  She thought about it wants to hold off before making a decision and she could not come to a decision today so I do not think it is proper to do the nail today that she feels uncomfortable with.  I did debride tissue to try to give her temporary relief and she will reschedule if she decides for permanent procedure

## 2024-07-27 NOTE — ED Provider Notes (Signed)
 Emergency Department Provider Note    ED Clinical Impression   Final diagnoses:  Vaginal bleeding (Primary)    ED Assessment/Plan    Condition: Stable Disposition: Discharge  This chart has been completed using Dragon Medical Dictation software, and while attempts have been made to ensure accuracy, certain words and phrases may not be transcribed as intended.   History   Chief Complaint  Patient presents with   Vaginal Bleeding    Vaginal bleeding times 1 month.     HPI  [name] is a 48 y.o. female  who presents today to the  emergency department complaining of vaginal bleeding.  Denies possibility of pregnancy.  Reports that she has 5 kids.  Most are greater than 20.  She is not interested in having additional kids.  Husband is at the bedside and verifies patient is eager to have a hysterectomy.  Want ambulation surgery.  Sent from urgent care for vaginal    Allergies: is allergic to losartan and lamotrigine . Medications: has a current medication list which includes the following long-term medication(s): aspirin and oxcarbazepine . PMHx:  has a past medical history of Acid reflux, Anemia, and Seizures    (CMS-HCC). PSHx:  has a past surgical history that includes Breast biopsy (Right, 01/06/2023); Tubal ligation; and pr colonoscopy flx dx w/collj spec when pfrmd (N/A, 03/24/2024). SocHx:  reports that she quit smoking about 13 years ago. Her smoking use included cigarettes. She has never used smokeless tobacco. She reports that she does not currently use alcohol. She reports that she does not use drugs. Allergies, Medications, Medical, Surgical, and Social History were reviewed as documented above.  Social Drivers of Health with Concerns   Tobacco Use: Medium Risk (07/27/2024)   Patient History    Smoking Tobacco Use: Former    Smokeless Tobacco Use: Never    Passive Exposure: Not on file  Alcohol Use: Not on file  Housing: Not on file  Substance Use:  Not on file (06/16/2023)  Health Literacy: Not on file  Internet Connectivity: Not on file    Review Of Systems  Review of Systems  Constitutional:  Negative for chills and fever.  HENT:  Negative for ear pain.   Respiratory:  Negative for chest tightness and shortness of breath.   Cardiovascular:  Negative for chest pain.  Gastrointestinal:  Negative for abdominal pain.  Genitourinary:  Negative for flank pain.  Musculoskeletal:  Negative for back pain.  Skin:  Negative for rash and wound.  Neurological:  Negative for weakness.  Psychiatric/Behavioral:  Negative for suicidal ideas.   All other systems reviewed and are negative.   Physical Exam   BP (!) 158/83   Pulse 68   Temp 37.1 C (98.8 F) (Oral)   Resp 16   Wt 80.3 kg (177 lb)   LMP 07/10/2024 (Approximate)   SpO2 98%   BMI 31.35 kg/m   Physical Exam Vitals and nursing note reviewed.  Constitutional:      Appearance: Normal appearance. She is not ill-appearing, toxic-appearing or diaphoretic.  HENT:     Head: Normocephalic.     Nose: Nose normal.     Mouth/Throat:     Mouth: Mucous membranes are moist.  Eyes:     Extraocular Movements: Extraocular movements intact.     Conjunctiva/sclera: Conjunctivae normal.     Pupils: Pupils are equal, round, and reactive to light.  Cardiovascular:     Rate and Rhythm: Normal rate and regular rhythm.     Pulses:  Normal pulses.     Heart sounds: Normal heart sounds.  Pulmonary:     Effort: Pulmonary effort is normal.     Breath sounds: Normal breath sounds.  Abdominal:     General: Bowel sounds are normal. There is no distension.     Palpations: Abdomen is soft.     Tenderness: There is no right CVA tenderness or left CVA tenderness.  Musculoskeletal:        General: No deformity.     Cervical back: Normal range of motion and neck supple.  Skin:    General: Skin is warm and dry.     Capillary Refill: Capillary refill takes less than 2 seconds.  Neurological:      General: No focal deficit present.     Mental Status: She is alert and oriented to person, place, and time. Mental status is at baseline.     Cranial Nerves: No cranial nerve deficit.     Sensory: No sensory deficit.  Psychiatric:        Mood and Affect: Mood normal.        Behavior: Behavior normal.        Thought Content: Thought content normal.        Judgment: Judgment normal.     ED Course  Medical Decision Making Future diagnosis include ectopic pregnancy, incomplete pregnancy, coagulopathy, threatened AB among others.  Patient's symptoms are secondary to vaginal bleeding.  I have encouraged her to take iron pills given her first dose of medroxyprogesterone  here DC home on the medroxy progesterone 10 mg x 5 days.  Follow-up outpatient with OB/GYN for definitive care  Diagnosis is dysfunctional uterine bleeding.  Not feel as if she needs any imaging based on exam and history given.  She has had multiple workups for this before in the past.  Plan is for discharge home  Risk Prescription drug management.     Procedures   No results found for this visit on 07/27/24 (from the past 4464 hours).        Alla Cleola Coffin, MD 07/29/24 [phone]

## 2024-08-07 ENCOUNTER — Encounter (HOSPITAL_BASED_OUTPATIENT_CLINIC_OR_DEPARTMENT_OTHER): Payer: Self-pay | Admitting: Emergency Medicine

## 2024-08-07 ENCOUNTER — Other Ambulatory Visit: Payer: Self-pay

## 2024-08-07 ENCOUNTER — Emergency Department (HOSPITAL_BASED_OUTPATIENT_CLINIC_OR_DEPARTMENT_OTHER)
Admission: EM | Admit: 2024-08-07 | Discharge: 2024-08-07 | Disposition: A | Discharge status: Home / Self Care | Attending: Emergency Medicine | Admitting: Emergency Medicine

## 2024-08-07 ENCOUNTER — Emergency Department (HOSPITAL_BASED_OUTPATIENT_CLINIC_OR_DEPARTMENT_OTHER)

## 2024-08-07 DIAGNOSIS — N939 Abnormal uterine and vaginal bleeding, unspecified: Secondary | ICD-10-CM | POA: Diagnosis present

## 2024-08-07 DIAGNOSIS — D649 Anemia, unspecified: Secondary | ICD-10-CM | POA: Insufficient documentation

## 2024-08-07 LAB — CBC WITH DIFFERENTIAL/PLATELET
Abs Immature Granulocytes: 0.02 K/uL (ref 0.00–0.07)
Basophils Absolute: 0 K/uL (ref 0.0–0.1)
Basophils Relative: 1 %
Eosinophils Absolute: 0.1 K/uL (ref 0.0–0.5)
Eosinophils Relative: 2 %
HCT: 24.2 % — ABNORMAL LOW (ref 36.0–46.0)
Hemoglobin: 7.2 g/dL — ABNORMAL LOW (ref 12.0–15.0)
Immature Granulocytes: 0 %
Lymphocytes Relative: 21 %
Lymphs Abs: 1.3 K/uL (ref 0.7–4.0)
MCH: 22 pg — ABNORMAL LOW (ref 26.0–34.0)
MCHC: 29.8 g/dL — ABNORMAL LOW (ref 30.0–36.0)
MCV: 73.8 fL — ABNORMAL LOW (ref 80.0–100.0)
Monocytes Absolute: 0.4 K/uL (ref 0.1–1.0)
Monocytes Relative: 7 %
Neutro Abs: 4.3 K/uL (ref 1.7–7.7)
Neutrophils Relative %: 69 %
Platelets: 394 K/uL (ref 150–400)
RBC: 3.28 MIL/uL — ABNORMAL LOW (ref 3.87–5.11)
RDW: 14.9 % (ref 11.5–15.5)
WBC: 6.2 K/uL (ref 4.0–10.5)
nRBC: 0 % (ref 0.0–0.2)

## 2024-08-07 LAB — BASIC METABOLIC PANEL WITH GFR
Anion gap: 9 (ref 5–15)
BUN: <5 mg/dL — ABNORMAL LOW (ref 6–20)
CO2: 26 mmol/L (ref 22–32)
Calcium: 9.1 mg/dL (ref 8.9–10.3)
Chloride: 105 mmol/L (ref 98–111)
Creatinine, Ser: 0.73 mg/dL (ref 0.44–1.00)
GFR, Estimated: >60 mL/min
Glucose, Bld: 99 mg/dL (ref 70–99)
Potassium: 3.7 mmol/L (ref 3.5–5.1)
Sodium: 139 mmol/L (ref 135–145)

## 2024-08-07 MED ORDER — MEDROXYPROGESTERONE ACETATE 10 MG PO TABS
10.0000 mg | ORAL_TABLET | Freq: Once | ORAL | Status: AC
  Administered 2024-08-07: 10 mg via ORAL
  Filled 2024-08-07: qty 1

## 2024-08-07 MED ORDER — MEDROXYPROGESTERONE ACETATE 5 MG PO TABS: 10.0000 mg | ORAL_TABLET | Freq: Every day | ORAL | 0 refills | Status: DC

## 2024-08-07 MED ORDER — MEDROXYPROGESTERONE ACETATE 10 MG PO TABS: 10.0000 mg | ORAL_TABLET | Freq: Three times a day (TID) | ORAL | 0 refills | Status: AC

## 2024-08-07 NOTE — Discharge Instructions (Addendum)
 Please read and follow all provided instructions.  Your diagnoses today include:  1. Vaginal bleeding   2. Anemia, unspecified type    Tests performed today include: Complete blood cell count: Shows progressive anemia, now down to 7.2 hemoglobin Basic metabolic panel: No concerning findings On vaginal ultrasound: Shows thickened endometrium, possibly small fibroid Vital signs. See below for your results today.   Medications prescribed:  Provera : Medication to slow vaginal bleeding  Take any prescribed medications only as directed.  Home care instructions:  Follow any educational materials contained in this packet.  BE VERY CAREFUL not to take multiple medicines containing Tylenol  (also called acetaminophen ). Doing so can lead to an overdose which can damage your liver and cause liver failure and possibly death.   Follow-up instructions: Please follow-up with your OB/GYN appointment scheduled in 2 days as planned.  I have provided referral for our OB/GYN in case if needed.  Return instructions:  Please return to the Emergency Department if you experience worsening symptoms.  Please return if you have worsening symptoms of anemia.  This can include severe shortness of breath with activity, lightheadedness or passing out, development of chest pain. Please return if you have any other emergent concerns.  Additional Information:  Your vital signs today were: BP 115/77 (BP Location: Right Arm)   Pulse 73   Temp 98.3 F (36.8 C) (Oral)   Resp 18   SpO2 100%  If your blood pressure (BP) was elevated above 135/85 this visit, please have this repeated by your doctor within one month. --------------

## 2024-08-07 NOTE — ED Provider Triage Note (Signed)
 Emergency Medicine Provider Triage Evaluation Note  ROSALVA Lopez , a 48 y.o. female  was evaluated in triage.  Pt complains of persistent vaginal bleeding for 1 year.  Reports that she has been to multiple urgent cares, reports that she feels that she has been dismissed by her OB/GYN, and she has not been able to get in with her primary care doctor when they schedule appointments.  Reports that she went to a urgent care/emergency department in [place] last week and was put on a birth control pill which stopped the bleeding for several days, however she ran out of the pills and the bleeding has returned.  Reports that she has generalized weakness, and that her Hb was down to 8 last week..  Review of Systems  Positive: Vaginal bleeding, weakness Negative: fever  Physical Exam  BP 132/82 (BP Location: Left Arm)   Pulse 71   Temp 98.6 F (37 C) (Oral)   Resp 17   SpO2 100%  Gen:   Awake, no distress   Resp:  Normal effort  MSK:   Moves extremities without difficulty   Medical Decision Making  Medically screening exam initiated at 12:04 PM.  Appropriate orders placed.  [name] was informed that the remainder of the evaluation will be completed by another provider, this initial triage assessment does not replace that evaluation, and the importance of remaining in the ED until their evaluation is complete.     Bonnie Jerilynn RAMAN, MD 08/07/24 [phone]

## 2024-08-07 NOTE — ED Triage Notes (Signed)
 Vaginal bleeding  X 1 year but worse in last month Seen a another hospital placed on hormone meds ( in [place])  Changing adult diapers hourly  Unable to get a hold of doctor

## 2024-08-07 NOTE — ED Notes (Deleted)
 Orthostatic VS needed

## 2024-08-07 NOTE — ED Provider Notes (Cosign Needed Addendum)
 " [place] EMERGENCY DEPARTMENT AT [facility] Provider Note   CSN: [id number] Arrival date & time: 08/07/24  1114     Patient presents with: Vaginal Bleeding   [name] is a 48 y.o. female.   Patient presents to the emergency department for evaluation of ongoing vaginal bleeding.  She is changing pads approximately every 1 hour.  She has had heavy bleeding over at least 1 year, it has been intermittent.  Patient was seen in outside emergency department on 12/18.  At that time her hemoglobin was 8.5.  She states that she was placed on medroxyprogesterone  10 mg twice a day for 5 days.  This helped her bleeding temporarily.  After she stopped, the bleeding returned and has been heavy.  She feels generally weak.  She has some lightheadedness and shortness of breath, but has not stopped her from doing activities.  No syncope.  She states that she had an OB/GYN appointment about a year ago, was placed on birth control.  This did not help.  Family member reports scheduling an appointment in [place] for her on 12/31.       Prior to Admission medications  Medication Sig Start Date End Date Taking? Authorizing Provider  busPIRone (BUSPAR) 10 MG tablet Take 10 mg by mouth 2 (two) times daily.    [provider]  Cholecalciferol 125 MCG (5000 UT) capsule Take 5,000 Units by mouth daily.    [provider]  FLUoxetine (PROZAC) 40 MG capsule Take 40 mg by mouth daily.    [provider]  hydrOXYzine  (ATARAX ) 50 MG tablet Take 50 mg by mouth 3 (three) times daily as needed for anxiety (sleep).    [provider]  ibuprofen (ADVIL,MOTRIN) 200 MG tablet Take 200-400 mg by mouth every 6 (six) hours as needed for moderate pain or cramping.    [provider]  Iron, Ferrous Sulfate, 325 (65 Fe) MG TABS 1 tablet Orally Three times a Week    [provider]  omeprazole (PRILOSEC OTC) 20 MG tablet Take 20 mg by mouth daily.    [provider]  Oxcarbazepine  (TRILEPTAL ) 300 MG tablet Take 1 tablet (300 mg total) by mouth 2 (two) times daily. Must call [phone] to schedule an appt to continue refills. 09/09/19   Onita Duos, MD    Allergies: Losartan and Lamictal  [lamotrigine ]    Review of Systems  Updated Vital Signs BP 132/82 (BP Location: Left Arm)   Pulse 71   Temp 98.6 F (37 C) (Oral)   Resp 17   SpO2 100%   Physical Exam Vitals and nursing note reviewed.  Constitutional:      General: She is not in acute distress.    Appearance: She is well-developed.  HENT:     Head: Normocephalic and atraumatic.     Right Ear: External ear normal.     Left Ear: External ear normal.     Nose: Nose normal.  Eyes:     Conjunctiva/sclera: Conjunctivae normal.  Cardiovascular:     Rate and Rhythm: Normal rate and regular rhythm.     Heart sounds: No murmur heard.    Comments: No tachycardia. Pulmonary:     Effort: No respiratory distress.     Breath sounds: No wheezing, rhonchi or rales.  Abdominal:     Palpations: Abdomen is soft.     Tenderness: There is no abdominal tenderness. There is no guarding or rebound.  Musculoskeletal:     Cervical back:  Normal range of motion and neck supple.     Right lower leg: No edema.     Left lower leg: No edema.  Skin:    General: Skin is warm and dry.     Findings: No rash.  Neurological:     General: No focal deficit present.     Mental Status: She is alert. Mental status is at baseline.     Motor: No weakness.  Psychiatric:        Mood and Affect: Mood normal.     (all labs ordered are listed, but only abnormal results are displayed) Labs Reviewed  CBC WITH DIFFERENTIAL/PLATELET - Abnormal; Notable for the following components:      Result Value   RBC 3.28 (*)    Hemoglobin 7.2 (*)    HCT 24.2 (*)    MCV 73.8 (*)    MCH 22.0 (*)    MCHC 29.8 (*)    All other components within normal limits  BASIC METABOLIC PANEL WITH GFR - Abnormal; Notable for the  following components:   BUN <5 (*)    All other components within normal limits    EKG: None  Radiology: US  PELVIC COMPLETE WITH TRANSVAGINAL Result Date: 08/07/2024 EXAM: US  Pelvis, Complete Transvaginal and Transabdominal without Doppler TECHNIQUE: Transabdominal and transvaginal pelvic duplex ultrasound using B-mode/gray scaled imaging without Doppler spectral analysis and color flow was obtained. COMPARISON: 01/20/2013 ultrasound CLINICAL HISTORY: Vaginal bleeding. FINDINGS: UTERUS: Uterus measures 11.2 x 5.7 x 6.7 cm. Heterogeneous, likely subserosal uterine fibroid measuring approximately 1.6 x 1.4 x 1.8 cm along the posterior uterine body. ENDOMETRIAL STRIPE: The endometrium is thickened, measuring 17 mm. RIGHT OVARY: Right ovary measures 2.7 x 2.1 x 2.6 cm. Right ovary is within normal limits. There is normal color Doppler flow. LEFT OVARY: The left ovary measures 4.7x3.0x3.0 cm. Left ovarian cyst measuring 2.9 x 2.1 x 2.6 cm. There is normal color Doppler flow. FREE FLUID: No free fluid. IMPRESSION: 1. Thickened endometrium measuring 17 mm. 2. Likely small subserosal uterine fibroid. Electronically signed by: Michaeline Blanch MD 08/07/2024 02:53 PM EST RP Workstation: [id number]     Procedures   Medications Ordered in the ED  medroxyPROGESTERone  (PROVERA ) tablet 10 mg (10 mg Oral Given 08/07/24 1548)    ED Course  Patient seen and examined. History obtained directly from patient.  Reviewed recent ED visit.  Labs/EKG: Personally read and interpreted labs ordered here, white blood cell count is 6.2, hemoglobin is 7.2 down from 8.5, 11 days ago; BMP unremarkable.  Imaging: Ultrasound results pending.  Medications/Fluids: None ordered  Most recent vital signs reviewed and are as follows: BP 132/82 (BP Location: Left Arm)   Pulse 71   Temp 98.6 F (37 C) (Oral)   Resp 17   SpO2 100%   Initial impression: Dysfunctional uterine bleeding, anemia.  4:01 PM Reassessment performed.  Patient appears stable.  Orthostatics were normal.  Orthostatic VS for the past 24 hrs:  BP- Lying Pulse- Lying BP- Sitting Pulse- Sitting BP- Standing at 0 minutes Pulse- Standing at 0 minutes  08/07/24 1508 130/75 87 134/67 65 125/82 70   Imaging results reviewed including: Pelvic ultrasound demonstrating thickened endometrium, possibly small fibroid  Reviewed pertinent lab work and imaging with patient at bedside. Questions answered.  I provided patient with a copy of her lab work and ultrasound results.  Confirmed appropriate patient.  Most current vital signs reviewed and are as follows: BP 115/77 (BP Location: Right Arm)   Pulse 73  Temp 98.3 F (36.8 C) (Oral)   Resp 18   SpO2 100%   Plan: Discharge to home.  Patient given a dose of medroxyprogesterone  10 mg.  Prescriptions written for: Medroxyprogesterone  10 mg thrice daily x 10 days (Up-to-date).  Other home care instructions discussed: Close monitoring of symptoms  ED return instructions discussed: Heavier bleeding or symptoms of worsening anemia including worsening shortness of breath, lightheadedness/syncope, chest pain.  Follow-up instructions discussed: Patient encouraged to follow-up with OB/GYN in 2 days.                                  Medical Decision Making Amount and/or Complexity of Data Reviewed Labs: ordered. Radiology: ordered.  Risk Prescription drug management.   Patient with ongoing vaginal bleeding, worsening anemia.  Hemoglobin has decreased to 7.2.  Fortunately patient's vital signs are normal, negative orthostatics.  Ultrasound performed today with results as above.  We discussed transfer for blood transfusion versus close follow-up as outpatient.  Patient was able to obtain an appointment in the next 48 hours.  She had temporary improvement on Provera  prescribed on the 18th, will provide with another prescription.  Patient has been given a dose of medication here in the emergency department.   We discussed signs and symptoms which should cause her to return occluding symptoms of worsening anemia or heavier bleeding.  She verbalizes understanding.  No indication for emergent evaluation today, but will need very close outpatient follow-up which she seems to have obtained.  The patient's vital signs, pertinent lab work and imaging were reviewed and interpreted as discussed in the ED course. Hospitalization was considered for further testing, treatments, or serial exams/observation. However as patient is well-appearing, has a stable exam, I do not feel that they warrant admission at this time. This plan was discussed with the patient who verbalizes agreement and comfort with this plan and seems reliable and able to return to the Emergency Department with worsening or changing symptoms.       Final diagnoses:  Vaginal bleeding  Anemia, unspecified type    ED Discharge Orders          Ordered    medroxyPROGESTERone  (PROVERA ) 5 MG tablet  Daily        08/07/24 1600               Desiderio Chew, PA-C 08/07/24 1605    Desiderio Chew, PA-C 08/07/24 1606  "

## 2024-08-18 NOTE — Progress Notes (Signed)
 This video encounter was conducted with the patient's (or proxy's) verbal consent via secure, interactive audio and video telecommunications while in clinic/office/hospital.  The patient (or proxy) was instructed to have this encounter in a suitably private space and to only have persons present to whom they give permission to participate. In addition, patient identity was confirmed by use of name plus an additional identifier.  This visit was coded based on medical decision making (MDM).   Obstetrics & Gynecology Office Visit   Chief Complaint: No chief complaint on file.   History of Present Illness: 49 y.o. No obstetric history on file. presenting for discussion of additional management option for a diagnosis of AUB with anemia.  She is currently being managed with Provera  taper.   The patient reports improvement in symptoms but continued bleeding.  On her current medication regimen has intermittent breakthrough bleeding.   She has not noted any side-effects or new symptoms.    [facility] laboratory work up within normal limits.  Patient premenopausal based on FSH/estradiol.  OSH ultrasound 08/07/2024 normal other than one small subserosal fibroid 1.6 x 1.4 x 1.8 cm.  Pap 08/09/2024 NILM HPV negative Endometrial biopsy 08/09/2024 secretory endometrium with breakdown changes, negative for EIN or malignancy.  Review of Systems: Review of Systems  Constitutional: Negative.   Gastrointestinal: Negative.   Genitourinary: Negative.   Psychiatric/Behavioral: Negative.       Past Medical History:    Past Surgical History:  No past surgical history on file.  Gynecologic History: No LMP recorded.  Obstetric History: No obstetric history on file.  Family History:  No family history on file.  Social History:  Social History   Socioeconomic History   Marital status: Unknown   Social Drivers of Corporate Investment Banker Strain: Low Risk  (08/09/2024)   Overall Financial  Resource Strain (CARDIA)    Difficulty of Paying Living Expenses: Not very hard  Food Insecurity: No Food Insecurity (08/09/2024)   Hunger Vital Sign    Worried About Running Out of Food in the Last Year: Never true    Ran Out of Food in the Last Year: Never true  Transportation Needs: No Transportation Needs (08/09/2024)   PRAPARE - Administrator, Civil Service (Medical): No    Lack of Transportation (Non-Medical): No  Physical Activity: Patient Declined (01/08/2023)   Received from [facility]   Exercise Vital Sign    On average, how many days per week do you engage in moderate to strenuous exercise (like a brisk walk)?: Patient declined    On average, how many minutes do you engage in exercise at this level?: Patient declined  Stress: Patient Declined (01/08/2023)   Received from [facility] - Occupational Stress Questionnaire    Feeling of Stress : Patient declined  Social Connections: Patient Declined (01/08/2023)   Received from [facility]   Social Connection and Isolation Panel    In a typical week, how many times do you talk on the phone with family, friends, or neighbors?: Patient declined    How often do you get together with friends or relatives?: Patient declined    How often do you attend church or religious services?: Patient declined    Do you belong to any clubs or organizations such as church groups, unions, fraternal or athletic groups, or school groups?: Patient declined    How often do you attend meetings of the clubs or organizations you belong to?: Patient  declined    Are you married, widowed, divorced, separated, never married, or living with a partner?: Patient declined  Housing Stability: Low Risk  (08/09/2024)   Housing Stability Vital Sign    Unable to Pay for Housing in the Last Year: No    Number of Times Moved in the Last Year: 1    Homeless in the Last Year: No    Allergies:  Not  on File  Medications: Prior to Admission medications  Medication Sig Taking? Last Dose  ferrous sulfate 325 (65 FE) MG EC tablet Take 1 tablet (325 mg total) by mouth daily with breakfast    medroxyPROGESTERone  (PROVERA ) 10 MG tablet Take 2 tablets (20 mg total) by mouth 3 (three) times daily for 7 days, THEN 2 tablets (20 mg total) once daily for 23 days.      Physical Exam Vitals: There were no vitals filed for this visit. No LMP recorded.  General: NAD HEENT: normocephalic, anicteric Pulmonary: No increased work of breathing Neurologic: Grossly intact Psychiatric: mood appropriate, affect full  Female chaperone present for pelvic  portions of the physical exam  Assessment: 49 y.o. follow up for AUB  Plan: Problem List Items Addressed This Visit   None Visit Diagnoses       Abnormal uterine bleeding (AUB)    -  Primary     Acute blood loss anemia       Relevant Orders   Ambulatory Referral to Hematology-Benign      1) AUB - reviewed work up thus far with no clearly identifiable etiology.  Patient ultimately would like to proceed with hysterectomy.  However, at present her blood counts are to low.  She has responded adequately to provera  although has not completely stopped bleeding - continue provera  - Mirena placement in 1-2 weeks.  - Hematology referral [facility] for evaluation of iron infusion closer to home - ideally Hgb above 10 to proceed with TLH  Return in about 2 weeks (around 09/01/2024) for 2-4 Mirena IUD placement [facility]).
